# Patient Record
Sex: Female | Born: 1955 | Race: White | Hispanic: No | Smoking: Never smoker
Health system: Southern US, Community
[De-identification: ages and names within clinical notes are randomized; demographics above are authoritative.]

## PROBLEM LIST (undated history)

## (undated) DIAGNOSIS — M1990 Unspecified osteoarthritis, unspecified site: Secondary | ICD-10-CM

## (undated) HISTORY — PX: OOPHORECTOMY: SHX86

## (undated) HISTORY — PX: ABDOMINAL HYSTERECTOMY: SHX81

## (undated) HISTORY — PX: BACK SURGERY: SHX140

---

## 1998-04-14 ENCOUNTER — Encounter: Payer: Self-pay | Admitting: Neurosurgery

## 1998-04-14 ENCOUNTER — Ambulatory Visit (HOSPITAL_COMMUNITY): Admission: RE | Admit: 1998-04-14 | Discharge: 1998-04-14 | Payer: Self-pay | Admitting: Neurosurgery

## 1998-04-29 ENCOUNTER — Encounter: Payer: Self-pay | Admitting: Neurosurgery

## 1998-04-29 ENCOUNTER — Ambulatory Visit (HOSPITAL_COMMUNITY): Admission: RE | Admit: 1998-04-29 | Discharge: 1998-04-29 | Payer: Self-pay | Admitting: Neurosurgery

## 1998-04-30 ENCOUNTER — Encounter: Payer: Self-pay | Admitting: Neurosurgery

## 1998-04-30 ENCOUNTER — Ambulatory Visit (HOSPITAL_COMMUNITY): Admission: RE | Admit: 1998-04-30 | Discharge: 1998-04-30 | Payer: Self-pay | Admitting: Neurosurgery

## 1998-05-04 ENCOUNTER — Encounter: Payer: Self-pay | Admitting: Neurosurgery

## 1998-05-05 ENCOUNTER — Inpatient Hospital Stay (HOSPITAL_COMMUNITY): Admission: RE | Admit: 1998-05-05 | Discharge: 1998-05-07 | Payer: Self-pay | Admitting: Neurosurgery

## 1998-05-05 ENCOUNTER — Encounter: Payer: Self-pay | Admitting: Neurosurgery

## 1998-09-07 DIAGNOSIS — C4491 Basal cell carcinoma of skin, unspecified: Secondary | ICD-10-CM

## 1998-09-07 HISTORY — DX: Basal cell carcinoma of skin, unspecified: C44.91

## 1998-12-10 DIAGNOSIS — D229 Melanocytic nevi, unspecified: Secondary | ICD-10-CM

## 1998-12-10 HISTORY — DX: Melanocytic nevi, unspecified: D22.9

## 1999-01-10 ENCOUNTER — Ambulatory Visit (HOSPITAL_COMMUNITY): Admission: RE | Admit: 1999-01-10 | Discharge: 1999-01-10 | Payer: Self-pay | Admitting: Gastroenterology

## 2000-03-28 ENCOUNTER — Other Ambulatory Visit: Admission: RE | Admit: 2000-03-28 | Discharge: 2000-03-28 | Payer: Self-pay | Admitting: Obstetrics and Gynecology

## 2001-10-21 ENCOUNTER — Ambulatory Visit (HOSPITAL_COMMUNITY): Admission: RE | Admit: 2001-10-21 | Discharge: 2001-10-21 | Payer: Self-pay | Admitting: Family Medicine

## 2001-10-21 ENCOUNTER — Encounter: Payer: Self-pay | Admitting: Family Medicine

## 2002-02-07 ENCOUNTER — Encounter: Payer: Self-pay | Admitting: Family Medicine

## 2002-02-07 ENCOUNTER — Ambulatory Visit (HOSPITAL_COMMUNITY): Admission: RE | Admit: 2002-02-07 | Discharge: 2002-02-07 | Payer: Self-pay | Admitting: Family Medicine

## 2002-08-21 ENCOUNTER — Other Ambulatory Visit: Admission: RE | Admit: 2002-08-21 | Discharge: 2002-08-21 | Payer: Self-pay | Admitting: Obstetrics and Gynecology

## 2003-03-16 ENCOUNTER — Ambulatory Visit (HOSPITAL_COMMUNITY): Admission: RE | Admit: 2003-03-16 | Discharge: 2003-03-16 | Payer: Self-pay | Admitting: Family Medicine

## 2003-11-24 ENCOUNTER — Ambulatory Visit (HOSPITAL_COMMUNITY): Admission: RE | Admit: 2003-11-24 | Discharge: 2003-11-24 | Payer: Self-pay | Admitting: Family Medicine

## 2004-02-20 ENCOUNTER — Emergency Department (HOSPITAL_COMMUNITY): Admission: EM | Admit: 2004-02-20 | Discharge: 2004-02-20 | Payer: Self-pay | Admitting: Emergency Medicine

## 2004-05-11 ENCOUNTER — Ambulatory Visit (HOSPITAL_COMMUNITY): Admission: RE | Admit: 2004-05-11 | Discharge: 2004-05-11 | Payer: Self-pay | Admitting: Family Medicine

## 2004-06-06 ENCOUNTER — Ambulatory Visit (HOSPITAL_COMMUNITY): Admission: RE | Admit: 2004-06-06 | Discharge: 2004-06-06 | Payer: Self-pay | Admitting: Gastroenterology

## 2004-12-20 ENCOUNTER — Ambulatory Visit (HOSPITAL_COMMUNITY): Admission: RE | Admit: 2004-12-20 | Discharge: 2004-12-20 | Payer: Self-pay | Admitting: Family Medicine

## 2005-06-05 ENCOUNTER — Ambulatory Visit (HOSPITAL_COMMUNITY): Admission: RE | Admit: 2005-06-05 | Discharge: 2005-06-05 | Payer: Self-pay | Admitting: Obstetrics and Gynecology

## 2006-10-05 ENCOUNTER — Ambulatory Visit (HOSPITAL_COMMUNITY): Admission: RE | Admit: 2006-10-05 | Discharge: 2006-10-05 | Payer: Self-pay | Admitting: Family Medicine

## 2006-11-21 ENCOUNTER — Ambulatory Visit (HOSPITAL_COMMUNITY): Admission: RE | Admit: 2006-11-21 | Discharge: 2006-11-21 | Payer: Self-pay | Admitting: Family Medicine

## 2006-11-22 ENCOUNTER — Encounter (HOSPITAL_COMMUNITY): Admission: RE | Admit: 2006-11-22 | Discharge: 2006-12-11 | Payer: Self-pay | Admitting: Family Medicine

## 2007-06-04 ENCOUNTER — Emergency Department (HOSPITAL_COMMUNITY): Admission: EM | Admit: 2007-06-04 | Discharge: 2007-06-04 | Payer: Self-pay | Admitting: Emergency Medicine

## 2007-10-22 ENCOUNTER — Ambulatory Visit (HOSPITAL_COMMUNITY): Admission: RE | Admit: 2007-10-22 | Discharge: 2007-10-22 | Payer: Self-pay | Admitting: Family Medicine

## 2008-07-01 ENCOUNTER — Encounter (INDEPENDENT_AMBULATORY_CARE_PROVIDER_SITE_OTHER): Payer: Self-pay | Admitting: Obstetrics and Gynecology

## 2008-07-01 ENCOUNTER — Ambulatory Visit (HOSPITAL_COMMUNITY): Admission: RE | Admit: 2008-07-01 | Discharge: 2008-07-02 | Payer: Self-pay | Admitting: Obstetrics and Gynecology

## 2008-11-23 ENCOUNTER — Ambulatory Visit (HOSPITAL_COMMUNITY): Admission: RE | Admit: 2008-11-23 | Discharge: 2008-11-23 | Payer: Self-pay | Admitting: Family Medicine

## 2009-11-25 ENCOUNTER — Ambulatory Visit (HOSPITAL_COMMUNITY): Admission: RE | Admit: 2009-11-25 | Discharge: 2009-11-25 | Payer: Self-pay | Admitting: Obstetrics and Gynecology

## 2010-05-16 ENCOUNTER — Other Ambulatory Visit: Payer: Self-pay | Admitting: Dermatology

## 2010-06-22 LAB — CBC
HCT: 34.2 % — ABNORMAL LOW (ref 36.0–46.0)
HCT: 42.9 % (ref 36.0–46.0)
Hemoglobin: 11.6 g/dL — ABNORMAL LOW (ref 12.0–15.0)
Hemoglobin: 14.6 g/dL (ref 12.0–15.0)
MCHC: 33.9 g/dL (ref 30.0–36.0)
MCHC: 34.1 g/dL (ref 30.0–36.0)
MCV: 89.9 fL (ref 78.0–100.0)
MCV: 90.2 fL (ref 78.0–100.0)
Platelets: 216 10*3/uL (ref 150–400)
Platelets: 255 10*3/uL (ref 150–400)
RBC: 3.79 MIL/uL — ABNORMAL LOW (ref 3.87–5.11)
RBC: 4.77 MIL/uL (ref 3.87–5.11)
RDW: 13 % (ref 11.5–15.5)
RDW: 13.2 % (ref 11.5–15.5)
WBC: 13.6 10*3/uL — ABNORMAL HIGH (ref 4.0–10.5)
WBC: 8.8 10*3/uL (ref 4.0–10.5)

## 2010-06-22 LAB — URINALYSIS, ROUTINE W REFLEX MICROSCOPIC
Bilirubin Urine: NEGATIVE
Glucose, UA: NEGATIVE mg/dL
Hgb urine dipstick: NEGATIVE
Ketones, ur: NEGATIVE mg/dL
Nitrite: NEGATIVE
Protein, ur: NEGATIVE mg/dL
Specific Gravity, Urine: <1.005 — ABNORMAL LOW (ref 1.005–1.030)
Urobilinogen, UA: 0.2 mg/dL (ref 0.0–1.0)
pH: 6 (ref 5.0–8.0)

## 2010-06-22 LAB — COMPREHENSIVE METABOLIC PANEL
ALT: 25 U/L (ref 0–35)
AST: 23 U/L (ref 0–37)
Albumin: 4.1 g/dL (ref 3.5–5.2)
Alkaline Phosphatase: 108 U/L (ref 39–117)
BUN: 8 mg/dL (ref 6–23)
CO2: 28 mEq/L (ref 19–32)
Calcium: 9.6 mg/dL (ref 8.4–10.5)
Chloride: 101 mEq/L (ref 96–112)
Creatinine, Ser: 0.76 mg/dL (ref 0.4–1.2)
GFR calc Af Amer: >60 mL/min (ref >60)
GFR calc non Af Amer: >60 mL/min (ref >60)
Glucose, Bld: 91 mg/dL (ref 70–99)
Potassium: 3.8 mEq/L (ref 3.5–5.1)
Sodium: 136 mEq/L (ref 135–145)
Total Bilirubin: 0.5 mg/dL (ref 0.3–1.2)
Total Protein: 7.7 g/dL (ref 6.0–8.3)

## 2010-06-22 LAB — PROTIME-INR
INR: 0.9 (ref 0.00–1.49)
Prothrombin Time: 12.7 seconds (ref 11.6–15.2)

## 2010-06-22 LAB — APTT: aPTT: 27 seconds (ref 24–37)

## 2010-06-22 LAB — HEMOGLOBIN AND HEMATOCRIT, BLOOD
HCT: 38.1 % (ref 36.0–46.0)
Hemoglobin: 12.9 g/dL (ref 12.0–15.0)

## 2010-06-24 ENCOUNTER — Other Ambulatory Visit: Payer: Self-pay | Admitting: Obstetrics and Gynecology

## 2010-07-26 NOTE — Op Note (Signed)
NAMECAITLAIN, Ana Williams                ACCOUNT NO.:  [id number]   MEDICAL RECORD NO.:  [id number]          PATIENT TYPE:  AMB   LOCATION:  SDC                           FACILITY:  [facility]   PHYSICIAN:  Miguel Aschoff, M.D.       DATE OF BIRTH:  [birth year]   DATE OF PROCEDURE:  07/01/2008  DATE OF DISCHARGE:                               OPERATIVE REPORT   PREOPERATIVE DIAGNOSES:  1. Pelvic pain.  2. Dyspareunia.   POSTOPERATIVE DIAGNOSIS:  Pelvic adhesions.   PROCEDURES:  Laparoscopy with lysis of adhesions, bilateral laparoscopic  salpingo-oophorectomy.   SURGEON:  Miguel Aschoff, MD   ASSISTANT:  Luvenia Redden, MD   ANESTHESIA:  General.   COMPLICATIONS:  None.   JUSTIFICATION:  The patient is a 55 year old white female status post  prior hysterectomy with a long history of pelvic pain and dyspareunia.  Outpatient evaluation has not revealed the source of the pain and prior  therapeutic measures have not improved and she presents now to undergo  laparoscopy and laparotomy if indicated to try to resolve the pelvic  pain which was thought to be secondary to pelvic adhesions.  Risks and  benefits of the procedure were discussed with the patient.  Informed  consent has been obtained.   PROCEDURE:  The patient was taken to the operating room, placed in the  supine position, and general anesthesia was administered without  difficulty.  She was then placed in the dorsal lithotomy position,  prepped and draped in usual sterile fashion.  After this was done,  bladder was catheterized.  A sponge stick was placed in vagina with  sponges to allow manipulation of the cuff if needed.  Attention was then  directed to the umbilicus where a small infraumbilical incision was  made.  A Veress needle was inserted and the abdomen was insufflated with  3 liters of CO2.  Following insufflation, the trocar through the  laparoscope was placed followed by laparoscope itself.  Then under  direct visualization,  a 5-mm port was established in the right lower  quadrant and a 11-mm port established in the left lower quadrant without  difficulty.  Inspection revealed some adhesions of the omentum to the  left lateral pelvic side wall with filmy adhesions involving the  appendices epiploica and omentum to the left ovary.  On the right side,  the right ovary was noted to be adhesed to the apex of the vaginal cuff  and this was felt to be the source of the patient's dyspareunia.  No  other abnormalities were noted in the abdomen.  The gallbladder was  inspected and was noted to be within normal limits.  Grossly, the liver  was unremarkable.  At this point, the gyrus unit was introduced.  The  infundibulopelvic ligament on the right side was identified as was the  ureter, and at this point, the infundibulopelvic ligament was cauterized  and cut and the dissection continued medially towards the apex of vagina  using serial cauterizations and cuts to free the right ovary off of the  vaginal cuff and other  pelvic structures.  Care was done to avoid any  injury to adjacent vascular structures of the ureter or the bladder.  This was done successfully.  On the left side, the filmy adhesions  involving the omentum appendices epiploica were grasped with a gyrus  forceps, cauterized and cut, taken down without difficulty.  It was then  possible to elevate the left ovary to find the infundibulopelvic  ligament, cauterize this ligament and cut it, and then dissection  continued using serial cauterizations and cuts to free the left ovary  without difficulty.  At this point, inspection was made for hemostasis.  Hemostasis was excellent.  An EndoCatch unit was then placed through the  11-mm port.  The tubes and ovaries were placed into the EndoCatch bag  and brought out through the left lower quadrant incision.  It was then  possible to remove the specimen without difficulty.  Final inspection  was made for  hemostasis.  Hemostasis appeared to be excellent, and at  this point, the procedure was completed.  CO2 was allowed to escape.  The small incisions were then closed using subcuticular 4-0 Vicryl.  The  estimated blood loss of the procedure was minimal.  The patient  tolerated the procedure well.  The port sites prior to bringing the  patient to recovery room were injected with 1% Marcaine.  A total of 10  mL were used.   Plan is the patient to be observed overnight to be discharged home on  July 02, 2008.      Miguel Aschoff, M.D.  Electronically Signed     AR/MEDQ  D:  07/01/2008  T:  07/01/2008  Job:  [id number]

## 2010-07-29 NOTE — Op Note (Signed)
Ana Williams, Ana Williams                ACCOUNT NO.:  [id number]   MEDICAL RECORD NO.:  [id number]          PATIENT TYPE:  AMB   LOCATION:  ENDO                         FACILITY:  [facility]   PHYSICIAN:  John C. Madilyn Fireman, M.D.    DATE OF BIRTH:  29-Aug-1955   DATE OF PROCEDURE:  06/06/2004  DATE OF DISCHARGE:                                 OPERATIVE REPORT   PROCEDURE:  Colonoscopy.   INDICATIONS FOR PROCEDURE:  Family history of colon cancer in a first-degree  relative.   DESCRIPTION OF PROCEDURE:  The patient was placed in the left lateral  decubitus position and placed on the pulse monitor with continuous low-flow  oxygen delivered by nasal cannula.  She was sedated with 100 mcg IV Fentanyl  and 10 mg IV Versed.  The Olympus videocolonoscope was inserted into the  rectum and advanced to the cecum, confirmed by transillumination of  McBurney's point and visualization of the ileocecal valve and appendiceal  orifice.  The prep was good.  The cecum, ascending, transverse, descending,  and sigmoid colon all appeared normal, with no masses, polyps, diverticula,  or other mucosal abnormalities.  The rectum likewise appeared normal.  Retroflex view of the anus revealed no obvious internal hemorrhoids.  The  scope was then withdrawn.  The patient returned to the recovery room in  stable condition.  She tolerated the procedure well, and there were no  immediate complications.   IMPRESSION:  Normal colonoscopy.   PLAN:  Repeat study in five years.      JCH/MEDQ  D:  06/06/2004  T:  06/06/2004  Job:  [id number]   cc:   Brett Canales A. Cleta Alberts, M.D.  [address]  [place]  [place] [address]  Fax: [phone]

## 2010-10-17 ENCOUNTER — Other Ambulatory Visit: Payer: Self-pay | Admitting: Dermatology

## 2011-06-12 ENCOUNTER — Other Ambulatory Visit (HOSPITAL_COMMUNITY): Payer: Self-pay | Admitting: Obstetrics and Gynecology

## 2011-06-12 DIAGNOSIS — Z139 Encounter for screening, unspecified: Secondary | ICD-10-CM

## 2011-06-16 ENCOUNTER — Ambulatory Visit (HOSPITAL_COMMUNITY): Payer: Self-pay

## 2011-06-20 ENCOUNTER — Ambulatory Visit (HOSPITAL_COMMUNITY)
Admission: RE | Admit: 2011-06-20 | Discharge: 2011-06-20 | Disposition: A | Discharge status: Home / Self Care | Payer: Managed Care, Other (non HMO) | Source: Ambulatory Visit | Attending: Obstetrics and Gynecology | Admitting: Obstetrics and Gynecology

## 2011-06-20 DIAGNOSIS — Z1231 Encounter for screening mammogram for malignant neoplasm of breast: Secondary | ICD-10-CM | POA: Insufficient documentation

## 2011-06-20 DIAGNOSIS — Z139 Encounter for screening, unspecified: Secondary | ICD-10-CM

## 2011-08-21 ENCOUNTER — Other Ambulatory Visit (HOSPITAL_COMMUNITY): Payer: Self-pay | Admitting: Family Medicine

## 2011-08-21 DIAGNOSIS — M25469 Effusion, unspecified knee: Secondary | ICD-10-CM

## 2011-08-21 DIAGNOSIS — M25569 Pain in unspecified knee: Secondary | ICD-10-CM

## 2011-08-22 ENCOUNTER — Ambulatory Visit (HOSPITAL_COMMUNITY)
Admission: RE | Admit: 2011-08-22 | Discharge: 2011-08-22 | Disposition: A | Discharge status: Home / Self Care | Payer: Managed Care, Other (non HMO) | Source: Ambulatory Visit | Attending: Family Medicine | Admitting: Family Medicine

## 2011-08-22 DIAGNOSIS — M25469 Effusion, unspecified knee: Secondary | ICD-10-CM | POA: Insufficient documentation

## 2011-08-22 DIAGNOSIS — M25569 Pain in unspecified knee: Secondary | ICD-10-CM

## 2011-08-22 DIAGNOSIS — M899 Disorder of bone, unspecified: Secondary | ICD-10-CM | POA: Insufficient documentation

## 2011-08-22 DIAGNOSIS — M2240 Chondromalacia patellae, unspecified knee: Secondary | ICD-10-CM | POA: Insufficient documentation

## 2011-08-24 ENCOUNTER — Encounter: Payer: Self-pay | Admitting: Sports Medicine

## 2011-08-24 ENCOUNTER — Ambulatory Visit (INDEPENDENT_AMBULATORY_CARE_PROVIDER_SITE_OTHER): Payer: Managed Care, Other (non HMO) | Admitting: Sports Medicine

## 2011-08-24 ENCOUNTER — Other Ambulatory Visit (HOSPITAL_COMMUNITY): Payer: Managed Care, Other (non HMO)

## 2011-08-24 VITALS — BP 120/70 | Ht 69.0 in | Wt 168.0 lb

## 2011-08-24 DIAGNOSIS — M958 Other specified acquired deformities of musculoskeletal system: Secondary | ICD-10-CM | POA: Insufficient documentation

## 2011-08-24 DIAGNOSIS — M959 Acquired deformity of musculoskeletal system, unspecified: Secondary | ICD-10-CM

## 2011-08-24 DIAGNOSIS — S83209A Unspecified tear of unspecified meniscus, current injury, unspecified knee, initial encounter: Secondary | ICD-10-CM | POA: Insufficient documentation

## 2011-08-24 DIAGNOSIS — IMO0002 Reserved for concepts with insufficient information to code with codable children: Secondary | ICD-10-CM

## 2011-08-24 NOTE — Patient Instructions (Addendum)
Continue naproxen Quad exercises Glucosamine 1500mg  oral daily No running for 4 weeks, ok to bike and swim F/U in 4 weeks.

## 2011-08-24 NOTE — Progress Notes (Signed)
  Subjective:    Patient ID: Ana Williams, female    DOB: 12-16-1955, 56 y.o.   MRN: [id number]  HPI  Ana Williams is a pleasant 56 yo female patient, coming to our of his as a new patient complaining of left knee pain since last week, he denies any injury to her knee. She states that her left knee started hurting after she was running and swimming last week and subtly her knee locked in a flexed position, she developed severe pain and mild swelling, she was able to straighten her knee later. She was seen by her primary care physician would start her on naproxen as an anti-inflammatory and order a left knee MRI. She stated that she is 90% better compared with last week. She has full range of motion on her knee, mild pain in the anterior and medial aspect of the knee especially with walking up and down the steps, nor swelling, no instability, no numbness or tingling, no mechanical symptoms. The MRI showed a 7 mm OCD on the left femoral condyle, a degenerative tear tear of the anterior lateral menisci, degenerative tear of the posterior horn of the medial menisci and patellofemoral chondromalacia.  Patient Active Problem List  Diagnosis  . Osteochondral defect of femoral condyle  . Acute meniscal tear of knee   No current outpatient prescriptions on file prior to visit.   Allergies  Allergen Reactions  . Penicillins   . Sulfa Antibiotics      Review of Systems  Constitutional: Negative for fever, chills, diaphoresis and fatigue.  Musculoskeletal: Negative for back pain, joint swelling, arthralgias and gait problem.  Neurological: Negative for weakness and numbness.       Objective:   Physical Exam  Musculoskeletal:       Left knee with intact skin, FROM. Patellofemoral crepitus present with flexion and extension. Patellofemoral compression  test +. No tenderness on the quad neither or patellar tendon. Mild TTP in the medial joint line Ligaments intact. Lachman neg. Varus and  valgus test at 0 and 30 degres neg Poor quad muscle definition.   Feet with hallux valgus, wide forefoot, collapse mid arche Normal gait without a limp. Feet with mid arch.            Assessment & Plan:   1. Acute meniscal tear of knee   Left knee  2. Osteochondral defect of femoral condyle   lateral femoral condyle left knee   We have discussed with the patient today nature of her knee pain. We reviewed hair on extent the MRI findings. If she develop recurrent mechanical symptoms we can try a interarticular cortisone injection or proceed with a referral to an orthopedic for a knee scope. She has improved 90% with conservative treatment resumed on continue that option of treatment. The patient agrees .  Continue naproxen Quad exercises Glucosamine 1500mg  oral daily No running for 4 weeks, ok to bike and swim F/U in 4 weeks. Knee compression sleeve.

## 2011-09-21 ENCOUNTER — Encounter: Payer: Self-pay | Admitting: Sports Medicine

## 2011-09-21 ENCOUNTER — Ambulatory Visit (INDEPENDENT_AMBULATORY_CARE_PROVIDER_SITE_OTHER): Payer: Managed Care, Other (non HMO) | Admitting: Sports Medicine

## 2011-09-21 VITALS — BP 100/60

## 2011-09-21 DIAGNOSIS — IMO0002 Reserved for concepts with insufficient information to code with codable children: Secondary | ICD-10-CM

## 2011-09-21 DIAGNOSIS — M959 Acquired deformity of musculoskeletal system, unspecified: Secondary | ICD-10-CM

## 2011-09-21 DIAGNOSIS — S83209A Unspecified tear of unspecified meniscus, current injury, unspecified knee, initial encounter: Secondary | ICD-10-CM

## 2011-09-21 DIAGNOSIS — M958 Other specified acquired deformities of musculoskeletal system: Secondary | ICD-10-CM

## 2011-09-21 NOTE — Assessment & Plan Note (Signed)
Patient is improved with compression and relative rest.   Plan to continue the use of the knee sleeve. Additionally will initiate walking and elliptical training and exercise regime.   After one month if elliptical walking goes well we'll start light running. Will followup in 2 months.

## 2011-09-21 NOTE — Progress Notes (Signed)
Ana Williams is a 56 y.o. female who presents to [facility] today for followup left knee. Patient was evaluated in clinic approximately one month ago for a left meniscus injury as well as an OCD lesion to the femoral condyle.  She was treated with a knee sleeve as well as relative reduction of activity.   She feels much better. She loves her knee sleeve and is ready to restart her activity. Additionally she is taking Aleve one tablet twice a day and glucosamine and chondroitin as well as calcium.  She denies any locking catching or giving way or significant effusion.    PMH reviewed.  History  Substance Use Topics  . Smoking status: Not on file  . Smokeless tobacco: Not on file  . Alcohol Use: Not on file   ROS as above otherwise neg   Exam:  BP 100/60 Gen: Well NAD MSK: Left knee reveals no effusions or significant deformities. Range of motion is intact.  Negative Lachman's and negative McMurray's.  Patient does have some laxity of the LCL bilaterally however this is mild and without pain.  MCL is intact  Mr Knee Left  Wo Contrast  08/23/2011  *RADIOLOGY REPORT*  Clinical Data: Left knee pain. Joint effusion.  MRI OF THE LEFT KNEE WITHOUT CONTRAST  Technique:  Multiplanar, multisequence MR imaging of the left knee was performed.  No intravenous contrast was administered.  Comparison:   None  Findings:  There is a 7 mm focal full-thickness cartilage defect in the posterior central aspect of the lateral femoral condyle.  No definable loose body in the joint.  There is diffuse chondromalacia of the patella with marginal spur formation and small focal areas of grade 4 chondromalacia of the trochlear groove of the distal femur.  There is degeneration of the anterior horn of the lateral meniscus without a discrete tear.  There is slight focal fraying of the posterior horn of the medial meniscus without tear.  Slight marginal spur formation in the medial and lateral compartments.  Cruciate and  collateral ligaments are normal.  Distal quadriceps tendon and patellar tendon are normal.  No joint effusion.  Tiny Baker's cyst.  IMPRESSION: 1.  Moderate chondromalacia of the patellofemoral compartment. 2.  7 mm focal full-thickness cartilage defect in the lateral femoral condyle. 3.  Slight degeneration of the menisci without meniscal tears.  Original Report Authenticated By: Gwynn Burly, M.D.

## 2011-09-21 NOTE — Patient Instructions (Addendum)
Thank you for coming in today. You may swim with the knee sleeve if you want.  Gradually return to exercise.  Increase bike and swimming.  Start walking and elliptical.  If those go well please start light running about 1/2 of your normal level.  Continue quad exercises.  Come back in 2 months.  You can buy the Body Helix knee sleeves online. They have a nice website.  Your's is a Size Large Knee sleeve.  Go slow and stop with pain.

## 2011-09-21 NOTE — Assessment & Plan Note (Signed)
Not sure that this is symptomatic and certainly seems to have improved sxs today. We will follow to see if issues arise.

## 2011-11-22 ENCOUNTER — Ambulatory Visit (INDEPENDENT_AMBULATORY_CARE_PROVIDER_SITE_OTHER): Payer: Managed Care, Other (non HMO) | Admitting: Sports Medicine

## 2011-11-22 VITALS — BP 105/68 | Ht 68.0 in | Wt 169.0 lb

## 2011-11-22 DIAGNOSIS — M959 Acquired deformity of musculoskeletal system, unspecified: Secondary | ICD-10-CM

## 2011-11-22 DIAGNOSIS — IMO0002 Reserved for concepts with insufficient information to code with codable children: Secondary | ICD-10-CM

## 2011-11-22 DIAGNOSIS — M958 Other specified acquired deformities of musculoskeletal system: Secondary | ICD-10-CM

## 2011-11-22 DIAGNOSIS — S83209A Unspecified tear of unspecified meniscus, current injury, unspecified knee, initial encounter: Secondary | ICD-10-CM

## 2011-11-22 MED ORDER — NAPROXEN 500 MG PO TABS: 500.0000 mg | ORAL_TABLET | Freq: Two times a day (BID) | ORAL | Status: DC | PRN

## 2011-11-22 NOTE — Progress Notes (Signed)
  Subjective:    Patient ID: Ana Williams, female    DOB: May 29, 1955, 56 y.o.   MRN: [id number]  HPI  Pt presents to clinic for f/u of lt lateral knee pain which is improving. Has been using bodyhelix sleeve which is helpful. She has some clicking laterally but no pain with this MRI showed lat OCD lesion Since last visit has been walking and using elliptical for exercise. Did try easy running on treadmill last week, which she feels aggravated knee somewhat  No swelling as long as she uses copression No mechanical sxs   Review of Systems     Objective:   Physical Exam  knee exam: Flexion to 140 deg bilat Stable ligaments McMurray neg Thessaly neg No effusion   Transverse arch collapse on lt not rt Long arch loss bilat Early bunion change bilat Bunionette on lt           Assessment & Plan:

## 2011-11-22 NOTE — Assessment & Plan Note (Signed)
No meniscal tests postiive today  No mechanical sxs  Avoid deep knee bend activity  Start easy weight work for [employer]

## 2011-11-22 NOTE — Patient Instructions (Addendum)
Use compression sleeve for activity   Ok to start run/walking on treadmill - do easy running, concentrate form  Continue with elliptical, walking, and biking   Ok to do HALF squats, lunges, knee extensions, and step ups   Please schedule a f/u for custom orthotics- bring running shoes and shorts  Thank you for seeing [place] today!

## 2011-11-22 NOTE — Assessment & Plan Note (Signed)
This has steadily improved  She can gradually add more weight bearing activity  I think she needs good custom orthotics to get back into running as she has marked breakdown of feet  We will bring her back for this

## 2011-12-06 ENCOUNTER — Encounter: Payer: Self-pay | Admitting: Family Medicine

## 2011-12-06 ENCOUNTER — Ambulatory Visit (INDEPENDENT_AMBULATORY_CARE_PROVIDER_SITE_OTHER): Payer: Managed Care, Other (non HMO) | Admitting: Family Medicine

## 2011-12-06 VITALS — BP 132/74 | HR 70 | Ht 68.0 in | Wt 168.0 lb

## 2011-12-06 DIAGNOSIS — R269 Unspecified abnormalities of gait and mobility: Secondary | ICD-10-CM

## 2011-12-06 NOTE — Patient Instructions (Addendum)
Thank you for coming in today. Try those orthotics during the day and with exercise.  Come back if they are painful or uncomfortable.  Come back as needed for pain.  It may be a good idea to stop by in 2 months for a repeat evaluation.

## 2011-12-07 DIAGNOSIS — R269 Unspecified abnormalities of gait and mobility: Secondary | ICD-10-CM | POA: Insufficient documentation

## 2011-12-07 NOTE — Progress Notes (Signed)
Ana Williams is a 56 y.o. female who presents to [facility] today for Patient is here today for  orthotics.  She was seen by Dr. Darrick Penna on 11/29/11 for knee pain.  At that visit she was found to have significant foot breakdown and an abnormal gait.  She was asked to schedule today for custom orthotics.  She feels well and notes that her knee pain is improving with a compressive knee sleeve.  Her goal is to resume running if possible.   PMH reviewed. OCD lesion of the right knee History  Substance Use Topics  . Smoking status: Never Smoker   . Smokeless tobacco: Never Used  . Alcohol Use: Not on file   ROS as above otherwise neg   Exam:  BP 132/74  Pulse 70  Ht 5\' 8"  (1.727 m)  Wt 168 lb (76.204 kg)  BMI 25.54 kg/m2 Gen: Well NAD MSK: Marked foot breakdown. Significant bunions bilaterally. Collapse of the transverse arch bilaterally  Gait:  Slight antalgic gait secondary to right knee pain  Patient was fitted for a : standard, orthotic. The orthotic was heated and afterward the patient stood on the orthotic blank positioned on the orthotic stand. The patient was positioned in subtalar neutral position and 10 degrees of ankle dorsiflexion in a weight bearing stance. After completion of molding, a stable base was applied to the orthotic blank. The blank was ground to a stable position for weight bearing. Size 9: Base: Blue EVA Posting and Padding: First ray post BL  Patient was comfortable in the orthotics with gait trial.

## 2011-12-07 NOTE — Assessment & Plan Note (Signed)
Custom orthotics made today. Patient is comfortable in the orthotics. Plan to followup with Dr. Darrick Penna in one or 2 months for recheck of knee

## 2012-02-13 ENCOUNTER — Encounter: Payer: Managed Care, Other (non HMO) | Admitting: Sports Medicine

## 2012-03-14 ENCOUNTER — Encounter: Payer: Managed Care, Other (non HMO) | Admitting: Sports Medicine

## 2012-07-27 ENCOUNTER — Emergency Department (HOSPITAL_COMMUNITY): Payer: Managed Care, Other (non HMO)

## 2012-07-27 ENCOUNTER — Encounter (HOSPITAL_COMMUNITY): Payer: Self-pay

## 2012-07-27 ENCOUNTER — Emergency Department (HOSPITAL_COMMUNITY)
Admission: EM | Admit: 2012-07-27 | Discharge: 2012-07-27 | Disposition: A | Discharge status: Home / Self Care | Payer: Managed Care, Other (non HMO) | Attending: Emergency Medicine | Admitting: Emergency Medicine

## 2012-07-27 DIAGNOSIS — Z79899 Other long term (current) drug therapy: Secondary | ICD-10-CM | POA: Insufficient documentation

## 2012-07-27 DIAGNOSIS — R079 Chest pain, unspecified: Secondary | ICD-10-CM | POA: Insufficient documentation

## 2012-07-27 DIAGNOSIS — Z880 Allergy status to penicillin: Secondary | ICD-10-CM | POA: Insufficient documentation

## 2012-07-27 DIAGNOSIS — M549 Dorsalgia, unspecified: Secondary | ICD-10-CM | POA: Insufficient documentation

## 2012-07-27 LAB — BASIC METABOLIC PANEL
BUN: 9 mg/dL (ref 6–23)
Calcium: 9.5 mg/dL (ref 8.4–10.5)
Creatinine, Ser: 0.7 mg/dL (ref 0.50–1.10)
GFR calc Af Amer: >90 mL/min (ref >90)
GFR calc non Af Amer: >90 mL/min (ref >90)
Glucose, Bld: 98 mg/dL (ref 70–99)

## 2012-07-27 LAB — CBC WITH DIFFERENTIAL/PLATELET
Basophils Relative: 0 % (ref 0–1)
Eosinophils Absolute: 0.1 10*3/uL (ref 0.0–0.7)
Eosinophils Relative: 2 % (ref 0–5)
Hemoglobin: 13.1 g/dL (ref 12.0–15.0)
Lymphs Abs: 1.3 10*3/uL (ref 0.7–4.0)
MCH: 29.3 pg (ref 26.0–34.0)
MCHC: 33 g/dL (ref 30.0–36.0)
MCV: 88.8 fL (ref 78.0–100.0)
Monocytes Relative: 10 % (ref 3–12)
RBC: 4.47 MIL/uL (ref 3.87–5.11)

## 2012-07-27 MED ORDER — ASPIRIN 325 MG PO TABS
325.0000 mg | ORAL_TABLET | Freq: Once | ORAL | Status: AC
  Administered 2012-07-27: 325 mg via ORAL
  Filled 2012-07-27: qty 1

## 2012-07-27 NOTE — ED Notes (Signed)
Patient with no complaints at this time. Respirations even and unlabored. Skin warm/dry. Discharge instructions reviewed with patient at this time. Patient given opportunity to voice concerns/ask questions. Patient discharged at this time and left Emergency Department with steady gait.   

## 2012-07-27 NOTE — ED Provider Notes (Signed)
History  This chart was scribed for Joya Gaskins, MD by Shari Heritage, ED Scribe. The patient was seen in room APA09/APA09. Patient's care was started at 1027.   CSN: [id number]  Arrival date & time 07/27/12  1017   First MD Initiated Contact with Patient 07/27/12 1027      Chief Complaint  Patient presents with  . Chest Pain     The history is provided by the patient. No language interpreter was used.    HPI Comments: Ana Williams is a 57 y.o. female who presents to the Emergency Department complaining of intermittent, burning, left chest pain onset 1 week ago. Patient states that pain is usually located underneath the left breast. Chest pain is sometimes worse with arm movement. Her last occurrence of chest pain was this morning which was accompanied by mid back pain. She says that she is still having discomfort in her chest and back at this time. Patient took Mucinex earlier when she thought discomfort might be due to congestion, but she has not taken any aspirin. She denies fever, cough, shortness of breath, nausea, vomiting, lightheadedness or diaphoresis. There is no dyspnea on exertion, pain with inspiration, abdominal pain or leg swelling. Patient denies history of MI, DVT or PE. Patient has a family history of CHF (father) and pancreatic cancer (mother). Patient was advised to come to the ED by Dr. Sherwood Gambler.    PMH - none Family history - CHF    History  Substance Use Topics  . Smoking status: Never Smoker   . Smokeless tobacco: Never Used  . Alcohol Use: Not on file    OB History   Grav Para Term Preterm Abortions TAB SAB Ect Mult Living                  Review of Systems A complete 10 system review of systems was obtained and all systems are negative except as noted in the HPI and PMH.   Allergies  Penicillins and Sulfa antibiotics  Home Medications   Current Outpatient Rx  Name  Route  Sig  Dispense  Refill  . naproxen (NAPROSYN) 500 MG tablet   Oral   Take 1 tablet (500 mg total) by mouth 2 (two) times daily as needed.   60 tablet   2   BP 136/83  Pulse 78  Temp(Src) 98.3 F (36.8 C) (Oral)  Resp 18  SpO2 95%  Physical Exam CONSTITUTIONAL: Well developed/well nourished HEAD: Normocephalic/atraumatic EYES: EOMI/PERRL ENMT: Mucous membranes moist NECK: supple no meningeal signs SPINE:entire spine nontender CV: S1/S2 noted, no murmurs/rubs/gallops noted Chest- mild tenderness to left chest, no crepitance noted LUNGS: Lungs are clear to auscultation bilaterally, no apparent distress ABDOMEN: soft, nontender, no rebound or guarding GU:no cva tenderness NEURO: Pt is awake/alert, moves all extremitiesx4 EXTREMITIES: pulses normal, full ROM, no tenderness, no edema, calves nontender SKIN: warm, color normal PSYCH: no abnormalities of mood noted  ED Course  Procedures  COORDINATION OF CARE: 10:43 AM- Patient informed of current plan for treatment and evaluation and agrees with plan at this time.   11:27 AM  I spoke to her PCP about this case who sent patient.  He felt (dr fusco) if pain improved and negative markers he could arrange outpatient stress Pt appears low risk, story somewhat atypical Now feeling well Will initiate workup   Pt improved in the ED HEART score less than 3 Repeat ekg/troponin negative I doubt ACS at this time.  PE unlikely give history/exam  Stable for d/c.  Do not feel she needs inpatient stay for further workup Dr Sherwood Gambler will see as outpatient   MDM  Nursing notes including past medical history and social history reviewed and considered in documentation Labs/vital reviewed and considered xrays reviewed and considered      Date: 07/27/2012 1028am  Rate: 75  Rhythm: normal sinus rhythm  QRS Axis: rightward  Intervals: normal  ST/T Wave abnormalities: normal  Conduction Disutrbances:none  Narrative Interpretation:   Old EKG Reviewed: unchanged    Date: 07/27/2012 1330  Rate: 82   Rhythm: normal sinus rhythm  QRS Axis: right  Intervals: normal  ST/T Wave abnormalities: normal  Conduction Disutrbances:none  Narrative Interpretation:   Old EKG Reviewed: unchanged     I personally performed the services described in this documentation, which was scribed in my presence. The recorded information has been reviewed and is accurate.      Joya Gaskins, MD 07/27/12 1515

## 2012-07-27 NOTE — ED Notes (Signed)
Pt reports mid-sternal cp for 1 week, pain is off/on, denies any sob, no cough or fever. pai is now in the mid back area. Describes as burning as sensation.

## 2012-09-11 ENCOUNTER — Ambulatory Visit: Payer: Managed Care, Other (non HMO) | Admitting: Cardiovascular Disease

## 2012-10-14 ENCOUNTER — Encounter: Payer: Self-pay | Admitting: *Deleted

## 2012-10-14 DIAGNOSIS — K5790 Diverticulosis of intestine, part unspecified, without perforation or abscess without bleeding: Secondary | ICD-10-CM

## 2012-10-14 DIAGNOSIS — C4491 Basal cell carcinoma of skin, unspecified: Secondary | ICD-10-CM | POA: Insufficient documentation

## 2012-10-14 DIAGNOSIS — M940 Chondrocostal junction syndrome [Tietze]: Secondary | ICD-10-CM | POA: Insufficient documentation

## 2012-10-14 DIAGNOSIS — K219 Gastro-esophageal reflux disease without esophagitis: Secondary | ICD-10-CM

## 2012-10-15 ENCOUNTER — Encounter: Payer: Self-pay | Admitting: Cardiovascular Disease

## 2012-10-15 ENCOUNTER — Encounter: Payer: Self-pay | Admitting: *Deleted

## 2012-10-15 ENCOUNTER — Ambulatory Visit (INDEPENDENT_AMBULATORY_CARE_PROVIDER_SITE_OTHER): Payer: 59 | Admitting: Cardiovascular Disease

## 2012-10-15 VITALS — BP 121/85 | HR 96 | Ht 68.0 in | Wt 194.0 lb

## 2012-10-15 DIAGNOSIS — R079 Chest pain, unspecified: Secondary | ICD-10-CM

## 2012-10-15 DIAGNOSIS — R635 Abnormal weight gain: Secondary | ICD-10-CM | POA: Insufficient documentation

## 2012-10-15 NOTE — Patient Instructions (Addendum)
Your physician recommends that you schedule a follow-up appointment in: AS NEEDED  Your physician has requested that you have an exercise tolerance test. For further information please visit [email or web address]. Please also follow instruction sheet, as given.    

## 2012-10-15 NOTE — Assessment & Plan Note (Signed)
Discussed low carb diet and exercise program She has a meniscal tear in left knee and has seen [employer].  F/U with him to assess activity level

## 2012-10-15 NOTE — Assessment & Plan Note (Signed)
Atypical likely GERD and or muscular  Normal ECG  F/U ETT

## 2012-10-15 NOTE — Progress Notes (Signed)
Patient ID: MAURICA OMURA, female   DOB: 1956-03-04, 57 y.o.   MRN: [id number]   KIP KAUTZMAN is a 57 y.o. female who presents to the Emergency Department 07/27/12  complaining of intermittent, burning, left chest pain onset 1 week ago. Patient states that pain is usually located underneath the left breast. Chest pain is sometimes worse with arm movement. Her last occurrence of chest pain was this morning which was accompanied by mid back pain. She says that she is still having discomfort in her chest and back at this time. Patient took Mucinex earlier when she thought discomfort might be due to congestion, but she has not taken any aspirin. She denies fever, cough, shortness of breath, nausea, vomiting, lightheadedness or diaphoresis. There is no dyspnea on exertion, pain with inspiration, abdominal pain or leg swelling. Patient denies history of MI, DVT or PE. Patient has a family history of CHF (father) and pancreatic cancer (mother). R/O and negative ER evaluaiton  Since then ok  A lot of her symptoms seem to be related to stress at work.  Some relief with NSAI's and some with TUMS  Has gained 30 lbs the last year and is more sedentary  ROS: Denies fever, malais, weight loss, blurry vision, decreased visual acuity, cough, sputum, SOB, hemoptysis, pleuritic pain, palpitaitons, heartburn, abdominal pain, melena, lower extremity edema, claudication, or rash.  All other systems reviewed and negative   General: Affect appropriate Healthy:  appears stated age HEENT: normal Neck supple with no adenopathy JVP normal no bruits no thyromegaly Lungs clear with no wheezing and good diaphragmatic motion Heart:  S1/S2 no murmur,rub, gallop or click PMI normal Abdomen: benighn, BS positve, no tenderness, no AAA no bruit.  No HSM or HJR Distal pulses intact with no bruits No edema Neuro non-focal Skin warm and dry No muscular weakness  Medications Current Outpatient Prescriptions  Medication Sig  Dispense Refill  . acetaminophen (TYLENOL) 500 MG tablet Take 1,000 mg by mouth every 6 (six) hours as needed for pain.      . naproxen sodium (ALEVE) 220 MG tablet Take 220 mg by mouth 3 (three) times daily as needed (pain).       No current facility-administered medications for this visit.    Allergies Penicillins and Sulfa antibiotics  Family History: No family history on file.  Social History: History   Social History  . Marital Status: Divorced    Spouse Name: N/A    Number of Children: N/A  . Years of Education: N/A   Occupational History  . Not on file.   Social History Main Topics  . Smoking status: Never Smoker   . Smokeless tobacco: Never Used  . Alcohol Use: No  . Drug Use: No  . Sexually Active: Yes    Birth Control/ Protection: Surgical   Other Topics Concern  . Not on file   Social History Narrative  . No narrative on file    Electrocardiogram:  NSR rate 90  Normal 07/27/12  NSR rate 76 normal and no different than today   Assessment and Plan

## 2012-10-23 ENCOUNTER — Ambulatory Visit (HOSPITAL_COMMUNITY)
Admission: RE | Admit: 2012-10-23 | Discharge: 2012-10-23 | Disposition: A | Discharge status: Home / Self Care | Payer: 59 | Source: Ambulatory Visit | Attending: Cardiovascular Disease | Admitting: Cardiovascular Disease

## 2012-10-23 ENCOUNTER — Encounter (HOSPITAL_COMMUNITY): Payer: Self-pay

## 2012-10-23 ENCOUNTER — Encounter: Payer: Self-pay | Admitting: Cardiology

## 2012-10-23 DIAGNOSIS — R079 Chest pain, unspecified: Secondary | ICD-10-CM

## 2012-10-23 NOTE — Progress Notes (Signed)
Stress Lab Nurses Notes - Ana Williams  Ana Williams 10/23/2012 Reason for doing test: Chest Pain Type of test: Regular GTX Nurse performing test: Parke Poisson, RN Nuclear Medicine Tech: Not Applicable Echo Tech: Not Applicable MD performing test: Ival Bible & Jacolyn Reedy PA Family MD: Fusco Test explained and consent signed: yes IV started: No IV started Symptoms: None Treatment/Intervention: None Reason test stopped: reached target HR After recovery IV was: NA Patient to return to Nuc. Med at :NA Patient discharged: Home Patient's Condition upon discharge was: stable Comments: During test peak BP 193/62 & HR 187.  Recovery BP 148/65 & HR 112.  Symptoms resolved in recovery. [name] T

## 2012-10-23 NOTE — Progress Notes (Signed)
Stress Lab Nurses Notes - Ana Williams   Ana Williams  10/23/2012  Reason for doing test: Chest Pain  Type of test: Regular GTX  Nurse performing test: Parke Poisson, RN  Nuclear Medicine Tech: Not Applicable  Echo Tech: Not Applicable  MD performing test: Ival Bible & Jacolyn Reedy PA  Family MD: Fusco  Test explained and consent signed: yes  IV started: No IV started  Symptoms: None  Treatment/Intervention: None  Reason test stopped: reached target HR  After recovery IV was: NA  Patient to return to Nuc. Med at :NA  Patient discharged: Home  Patient's Condition upon discharge was: stable  Comments: During test peak BP 193/62 & HR 187. Recovery BP 148/65 & HR 112. Symptoms resolved in recovery.  Ana Williams  Attending note:  Ana Williams, achieved 13 METS, adequate heart rate response, rapid increase in early exercise. No chest pain. Hypertensive response noted. Upsloping, nondiagnostic ST changes, no arrhythmias. Overall normal study for assessment of ischemia.  Ana Williams, M.D., F.A.C.C.

## 2012-10-24 ENCOUNTER — Telehealth: Payer: Self-pay | Admitting: *Deleted

## 2012-10-24 NOTE — Patient Instructions (Signed)
Spoke to pt to advise results/instructions. Pt understood.  

## 2012-10-24 NOTE — Telephone Encounter (Signed)
Spoke to pt to advise results/instructions. Pt understood.  

## 2012-10-24 NOTE — Telephone Encounter (Signed)
Message copied by Ovidio Kin on Thu Oct 24, 2012  3:57 PM ------      Message from: Wendall Stade      Created: Wed Oct 23, 2012  4:56 PM       Normal nuclear study            ----- Message -----         From: Jonelle Sidle, MD         Sent: 10/23/2012   4:08 PM           To: Wendall Stade, MD                   ------

## 2013-06-16 ENCOUNTER — Other Ambulatory Visit (HOSPITAL_COMMUNITY): Payer: Self-pay | Admitting: Obstetrics and Gynecology

## 2013-06-16 DIAGNOSIS — Z1231 Encounter for screening mammogram for malignant neoplasm of breast: Secondary | ICD-10-CM

## 2013-06-19 ENCOUNTER — Ambulatory Visit (HOSPITAL_COMMUNITY): Payer: 59

## 2013-06-19 ENCOUNTER — Ambulatory Visit (HOSPITAL_COMMUNITY)
Admission: RE | Admit: 2013-06-19 | Discharge: 2013-06-19 | Disposition: A | Discharge status: Home / Self Care | Payer: 59 | Source: Ambulatory Visit | Attending: Obstetrics and Gynecology | Admitting: Obstetrics and Gynecology

## 2013-06-19 DIAGNOSIS — Z1231 Encounter for screening mammogram for malignant neoplasm of breast: Secondary | ICD-10-CM | POA: Insufficient documentation

## 2014-04-22 ENCOUNTER — Other Ambulatory Visit: Payer: Self-pay | Admitting: Dermatology

## 2014-04-22 ENCOUNTER — Other Ambulatory Visit: Payer: Self-pay | Admitting: Obstetrics and Gynecology

## 2014-04-23 LAB — CYTOLOGY - PAP

## 2014-05-04 ENCOUNTER — Other Ambulatory Visit (HOSPITAL_COMMUNITY): Payer: Self-pay | Admitting: Internal Medicine

## 2014-05-05 ENCOUNTER — Other Ambulatory Visit (HOSPITAL_COMMUNITY): Payer: Self-pay | Admitting: Internal Medicine

## 2014-05-05 DIAGNOSIS — Z13820 Encounter for screening for osteoporosis: Secondary | ICD-10-CM

## 2014-05-05 DIAGNOSIS — M1991 Primary osteoarthritis, unspecified site: Secondary | ICD-10-CM

## 2014-05-08 ENCOUNTER — Ambulatory Visit (HOSPITAL_COMMUNITY)
Admission: RE | Admit: 2014-05-08 | Discharge: 2014-05-08 | Disposition: A | Discharge status: Home / Self Care | Payer: 59 | Source: Ambulatory Visit | Attending: Internal Medicine | Admitting: Internal Medicine

## 2014-05-08 DIAGNOSIS — M1991 Primary osteoarthritis, unspecified site: Secondary | ICD-10-CM | POA: Insufficient documentation

## 2014-05-08 DIAGNOSIS — Z13820 Encounter for screening for osteoporosis: Secondary | ICD-10-CM

## 2015-01-07 ENCOUNTER — Other Ambulatory Visit (HOSPITAL_COMMUNITY): Payer: Self-pay | Admitting: Obstetrics and Gynecology

## 2015-01-07 DIAGNOSIS — Z1231 Encounter for screening mammogram for malignant neoplasm of breast: Secondary | ICD-10-CM

## 2015-01-13 ENCOUNTER — Ambulatory Visit (HOSPITAL_COMMUNITY)
Admission: RE | Admit: 2015-01-13 | Discharge: 2015-01-13 | Disposition: A | Discharge status: Home / Self Care | Payer: 59 | Source: Ambulatory Visit | Attending: Obstetrics and Gynecology | Admitting: Obstetrics and Gynecology

## 2015-01-13 DIAGNOSIS — Z1231 Encounter for screening mammogram for malignant neoplasm of breast: Secondary | ICD-10-CM | POA: Diagnosis present

## 2015-08-11 ENCOUNTER — Other Ambulatory Visit: Payer: Self-pay | Admitting: Dermatology

## 2015-08-11 DIAGNOSIS — C4492 Squamous cell carcinoma of skin, unspecified: Secondary | ICD-10-CM

## 2015-08-11 HISTORY — DX: Squamous cell carcinoma of skin, unspecified: C44.92

## 2015-11-13 ENCOUNTER — Ambulatory Visit (INDEPENDENT_AMBULATORY_CARE_PROVIDER_SITE_OTHER): Payer: 59 | Admitting: Emergency Medicine

## 2015-11-13 ENCOUNTER — Ambulatory Visit (INDEPENDENT_AMBULATORY_CARE_PROVIDER_SITE_OTHER): Payer: 59

## 2015-11-13 VITALS — BP 118/70 | HR 82 | Temp 98.9°F | Resp 16 | Ht 68.0 in | Wt 211.0 lb

## 2015-11-13 DIAGNOSIS — R059 Cough, unspecified: Secondary | ICD-10-CM

## 2015-11-13 DIAGNOSIS — J029 Acute pharyngitis, unspecified: Secondary | ICD-10-CM | POA: Diagnosis not present

## 2015-11-13 DIAGNOSIS — R05 Cough: Secondary | ICD-10-CM

## 2015-11-13 LAB — POCT CBC
GRANULOCYTE PERCENT: 70.9 % (ref 37–80)
HEMATOCRIT: 39 % (ref 37.7–47.9)
Hemoglobin: 13.7 g/dL (ref 12.2–16.2)
Lymph, poc: 1.8 (ref 0.6–3.4)
MCH, POC: 29.6 pg (ref 27–31.2)
MCHC: 35 g/dL (ref 31.8–35.4)
MCV: 84.5 fL (ref 80–97)
MID (CBC): 0.4 (ref 0–0.9)
MPV: 7.4 fL (ref 0–99.8)
PLATELET COUNT, POC: 235 10*3/uL (ref 142–424)
POC GRANULOCYTE: 5.2 (ref 2–6.9)
POC LYMPH %: 24.3 % (ref 10–50)
POC MID %: 4.8 %M (ref 0–12)
RBC: 4.61 M/uL (ref 4.04–5.48)
RDW, POC: 13.2 %
WBC: 7.4 10*3/uL (ref 4.6–10.2)

## 2015-11-13 LAB — POCT RAPID STREP A (OFFICE): Rapid Strep A Screen: NEGATIVE

## 2015-11-13 MED ORDER — AZITHROMYCIN 250 MG PO TABS: ORAL_TABLET | ORAL | 0 refills | Status: DC

## 2015-11-13 MED ORDER — BENZONATATE 100 MG PO CAPS: 100.0000 mg | ORAL_CAPSULE | Freq: Three times a day (TID) | ORAL | 0 refills | Status: DC | PRN

## 2015-11-13 NOTE — Progress Notes (Signed)
By signing my name below, I, Moises Blood, attest that this documentation has been prepared under the direction and in the presence of Arlyss Queen, MD. Electronically Signed: Moises Blood, [place]. 11/13/2015 , 2:41 PM .  Patient was seen in room 4 .  Chief Complaint:  Chief Complaint  Patient presents with  . Other    chest congestion x 3day   . Sore Throat    HPI: Ana Williams is a 60 y.o. female who reports to [facility] today complaining of chest congestion and sore throat with voice loss for the past 3 days. She mentions feeling achy and waking up at night with cough. She also reports having a low grade fever (tmax 99). She's been taking dayquil, robitussin, and tylenol to be able to work. However, she is planning a trip going to the beach for a few days of vacation soon. She denies coughing up any sputum. She denies history of smoking. She denies history of asthma. She denies any known sick contact at work or at home. She denies seasonal allergies.   She lives alone at home.  Her father passed away with lung cancer; he was a heavy smoker.   No past medical history on file. Past Surgical History:  Procedure Laterality Date  . ABDOMINAL HYSTERECTOMY    . BACK SURGERY    . OOPHORECTOMY     Social History   Social History  . Marital status: Divorced    Spouse name: N/A  . Number of children: N/A  . Years of education: N/A   Social History Main Topics  . Smoking status: Never Smoker  . Smokeless tobacco: Never Used  . Alcohol use No  . Drug use: No  . Sexual activity: Yes    Birth control/ protection: Surgical   Other Topics Concern  . None   Social History Narrative  . None   No family history on file. Allergies  Allergen Reactions  . Penicillins     Childhood allergy  . Sulfa Antibiotics Nausea And Vomiting   Prior to Admission medications   Not on File     ROS:  Constitutional: negative for chills, night sweats, weight changes; positive for  fatigue, fever HEENT: negative for vision changes, hearing loss, rhinorrhea, epistaxis, or sinus pressure; positive for sore throat, congestion, voice loss Cardiovascular: negative for chest pain or palpitations Respiratory: negative for hemoptysis, wheezing, shortness of breath; positive for cough Abdominal: negative for abdominal pain, nausea, vomiting, diarrhea, or constipation Dermatological: negative for rash Musc: positive for myalgia Neurologic: negative for headache, dizziness, or syncope All other systems reviewed and are otherwise negative with the exception to those above and in the HPI.  PHYSICAL EXAM: Vitals:   11/13/15 1414  BP: 118/70  Pulse: 82  Resp: 16  Temp: 98.9 F (37.2 C)   Body mass index is 32.08 kg/m.   General: Alert, no acute distress HEENT:  Normocephalic, atraumatic, oropharynx patent; raspy voice, ears normal, mild nasal congestion, throat is red Eye: EOMI, PEERLDC Cardiovascular:  Regular rate and rhythm, no rubs murmurs or gallops.  No Carotid bruits, radial pulse intact. No pedal edema.  Respiratory: Clear to auscultation bilaterally.  No wheezes, rales, or rhonchi.  No cyanosis, no use of accessory musculature Abdominal: No organomegaly, abdomen is soft and non-tender, positive bowel sounds. No masses. Musculoskeletal: Gait intact. No edema, tenderness Skin: No rashes. Neurologic: Facial musculature symmetric. Psychiatric: Patient acts appropriately throughout our interaction.  Lymphatic: No cervical or submandibular lymphadenopathy Genitourinary/Anorectal: No  acute findings  LABS: Results for orders placed or performed in visit on 11/13/15  POCT CBC  Result Value Ref Range   WBC 7.4 4.6 - 10.2 K/uL   Lymph, poc 1.8 0.6 - 3.4   POC LYMPH PERCENT 24.3 10 - 50 %L   MID (cbc) 0.4 0 - 0.9   POC MID % 4.8 0 - 12 %M   POC Granulocyte 5.2 2 - 6.9   Granulocyte percent 70.9 37 - 80 %G   RBC 4.61 4.04 - 5.48 M/uL   Hemoglobin 13.7 12.2 - 16.2  g/dL   HCT, POC 39.0 37.7 - 47.9 %   MCV 84.5 80 - 97 fL   MCH, POC 29.6 27 - 31.2 pg   MCHC 35.0 31.8 - 35.4 g/dL   RDW, POC 13.2 %   Platelet Count, POC 235 142 - 424 K/uL   MPV 7.4 0 - 99.8 fL  POCT rapid strep A  Result Value Ref Range   Rapid Strep A Screen Negative Negative   Meds ordered this encounter  Medications  . benzonatate (TESSALON) 100 MG capsule    Sig: Take 1-2 capsules (100-200 mg total) by mouth 3 (three) times daily as needed for cough.    Dispense:  40 capsule    Refill:  0  . azithromycin (ZITHROMAX) 250 MG tablet    Sig: Take 2 tabs PO x 1 dose, then 1 tab PO QD x 4 days    Dispense:  6 tablet    Refill:  0    EKG/XRAY:   Dg Chest 2 View  Result Date: 11/13/2015 CLINICAL DATA:  60 year old female with cough and congestion for 1 week. EXAM: CHEST  2 VIEW COMPARISON:  None. FINDINGS: The cardiomediastinal silhouette is unremarkable. There is no evidence of focal airspace disease, pulmonary edema, suspicious pulmonary nodule/mass, pleural effusion, or pneumothorax. No acute bony abnormalities are identified. IMPRESSION: No active cardiopulmonary disease. Electronically Signed   By: Margarette Canada M.D.   On: 11/13/2015 14:57     ASSESSMENT/PLAN: This appears to be a viral type illness. She will be treated with Tessalon Perles fluids and rest. She was given a prescription for Z-Pak carried to the beach and if no better in 48-72 hours she can get this filled.I personally performed the services described in this documentation, which was scribed in my presence. The recorded information has been reviewed and is accurate.  Gross sideeffects, risk and benefits, and alternatives of medications d/w patient. Patient is aware that all medications have potential sideeffects and we are unable to predict every sideeffect or drug-drug interaction that may occur.  Arlyss Queen MD 11/13/2015 2:34 PM

## 2015-11-13 NOTE — Patient Instructions (Addendum)
     IF you received an x-ray today, you will receive an invoice from [facility]. Please contact  Radiology at [phone] with questions or concerns regarding your invoice.   IF you received labwork today, you will receive an invoice from [facility]. Please contact [facility] at [phone] with questions or concerns regarding your invoice.   Our billing staff will not be able to assist you with questions regarding bills from these companies.  You will be contacted with the lab results as soon as they are available. The fastest way to get your results is to activate your My Chart account. Instructions are located on the last page of this paperwork. If you have not heard from us regarding the results in 2 weeks, please contact this office.      Cough, Adult Coughing is a reflex that clears your throat and your airways. Coughing helps to heal and protect your lungs. It is normal to cough occasionally, but a cough that happens with other symptoms or lasts a long time may be a sign of a condition that needs treatment. A cough may last only 2-3 weeks (acute), or it may last longer than 8 weeks (chronic). CAUSES Coughing is commonly caused by:  Breathing in substances that irritate your lungs.  A viral or bacterial respiratory infection.  Allergies.  Asthma.  Postnasal drip.  Smoking.  Acid backing up from the stomach into the esophagus (gastroesophageal reflux).  Certain medicines.  Chronic lung problems, including COPD (or rarely, lung cancer).  Other medical conditions such as heart failure. HOME CARE INSTRUCTIONS  Pay attention to any changes in your symptoms. Take these actions to help with your discomfort:  Take medicines only as told by your health care provider.  If you were prescribed an antibiotic medicine, take it as told by your health care provider. Do not stop taking the antibiotic even if you start to feel  better.  Talk with your health care provider before you take a cough suppressant medicine.  Drink enough fluid to keep your urine clear or pale yellow.  If the air is dry, use a cold steam vaporizer or humidifier in your bedroom or your home to help loosen secretions.  Avoid anything that causes you to cough at work or at home.  If your cough is worse at night, try sleeping in a semi-upright position.  Avoid cigarette smoke. If you smoke, quit smoking. If you need help quitting, ask your health care provider.  Avoid caffeine.  Avoid alcohol.  Rest as needed. SEEK MEDICAL CARE IF:   You have new symptoms.  You cough up pus.  Your cough does not get better after 2-3 weeks, or your cough gets worse.  You cannot control your cough with suppressant medicines and you are losing sleep.  You develop pain that is getting worse or pain that is not controlled with pain medicines.  You have a fever.  You have unexplained weight loss.  You have night sweats. SEEK IMMEDIATE MEDICAL CARE IF:  You cough up blood.  You have difficulty breathing.  Your heartbeat is very fast.   This information is not intended to replace advice given to you by your health care provider. Make sure you discuss any questions you have with your health care provider.   Document Released: 08/26/2010 Document Revised: 11/18/2014 Document Reviewed: 05/06/2014 Elsevier Interactive Patient Education 2016 [employer] Inc.  

## 2015-12-16 ENCOUNTER — Other Ambulatory Visit (HOSPITAL_COMMUNITY): Payer: Self-pay | Admitting: Internal Medicine

## 2015-12-16 DIAGNOSIS — Z1231 Encounter for screening mammogram for malignant neoplasm of breast: Secondary | ICD-10-CM

## 2016-01-17 ENCOUNTER — Ambulatory Visit (HOSPITAL_COMMUNITY)
Admission: RE | Admit: 2016-01-17 | Discharge: 2016-01-17 | Disposition: A | Discharge status: Home / Self Care | Payer: 59 | Source: Ambulatory Visit | Attending: Internal Medicine | Admitting: Internal Medicine

## 2016-01-17 DIAGNOSIS — Z1231 Encounter for screening mammogram for malignant neoplasm of breast: Secondary | ICD-10-CM | POA: Insufficient documentation

## 2016-04-09 DIAGNOSIS — G4733 Obstructive sleep apnea (adult) (pediatric): Secondary | ICD-10-CM | POA: Diagnosis not present

## 2016-04-28 DIAGNOSIS — G4733 Obstructive sleep apnea (adult) (pediatric): Secondary | ICD-10-CM | POA: Diagnosis not present

## 2016-05-10 DIAGNOSIS — G4733 Obstructive sleep apnea (adult) (pediatric): Secondary | ICD-10-CM | POA: Diagnosis not present

## 2016-06-10 DIAGNOSIS — G4733 Obstructive sleep apnea (adult) (pediatric): Secondary | ICD-10-CM | POA: Diagnosis not present

## 2016-07-10 DIAGNOSIS — G4733 Obstructive sleep apnea (adult) (pediatric): Secondary | ICD-10-CM | POA: Diagnosis not present

## 2016-07-28 DIAGNOSIS — T07XXXA Unspecified multiple injuries, initial encounter: Secondary | ICD-10-CM | POA: Diagnosis not present

## 2016-08-10 DIAGNOSIS — G4733 Obstructive sleep apnea (adult) (pediatric): Secondary | ICD-10-CM | POA: Diagnosis not present

## 2016-08-28 DIAGNOSIS — H43813 Vitreous degeneration, bilateral: Secondary | ICD-10-CM | POA: Diagnosis not present

## 2016-08-28 DIAGNOSIS — H31093 Other chorioretinal scars, bilateral: Secondary | ICD-10-CM | POA: Diagnosis not present

## 2016-09-09 DIAGNOSIS — G4733 Obstructive sleep apnea (adult) (pediatric): Secondary | ICD-10-CM | POA: Diagnosis not present

## 2016-10-10 DIAGNOSIS — G4733 Obstructive sleep apnea (adult) (pediatric): Secondary | ICD-10-CM | POA: Diagnosis not present

## 2016-11-10 DIAGNOSIS — G4733 Obstructive sleep apnea (adult) (pediatric): Secondary | ICD-10-CM | POA: Diagnosis not present

## 2016-12-10 DIAGNOSIS — G4733 Obstructive sleep apnea (adult) (pediatric): Secondary | ICD-10-CM | POA: Diagnosis not present

## 2016-12-27 DIAGNOSIS — Z0000 Encounter for general adult medical examination without abnormal findings: Secondary | ICD-10-CM | POA: Diagnosis not present

## 2016-12-28 DIAGNOSIS — R739 Hyperglycemia, unspecified: Secondary | ICD-10-CM | POA: Diagnosis not present

## 2016-12-28 DIAGNOSIS — Z1389 Encounter for screening for other disorder: Secondary | ICD-10-CM | POA: Diagnosis not present

## 2016-12-28 DIAGNOSIS — Z0000 Encounter for general adult medical examination without abnormal findings: Secondary | ICD-10-CM | POA: Diagnosis not present

## 2017-01-10 DIAGNOSIS — G4733 Obstructive sleep apnea (adult) (pediatric): Secondary | ICD-10-CM | POA: Diagnosis not present

## 2017-03-13 ENCOUNTER — Encounter (HOSPITAL_COMMUNITY): Payer: Self-pay | Admitting: *Deleted

## 2017-03-13 ENCOUNTER — Other Ambulatory Visit: Payer: Self-pay

## 2017-03-13 ENCOUNTER — Emergency Department (HOSPITAL_COMMUNITY)
Admission: EM | Admit: 2017-03-13 | Discharge: 2017-03-13 | Disposition: A | Discharge status: Home / Self Care | Payer: 59 | Attending: Emergency Medicine | Admitting: Emergency Medicine

## 2017-03-13 DIAGNOSIS — S61214A Laceration without foreign body of right ring finger without damage to nail, initial encounter: Secondary | ICD-10-CM | POA: Insufficient documentation

## 2017-03-13 DIAGNOSIS — W268XXA Contact with other sharp object(s), not elsewhere classified, initial encounter: Secondary | ICD-10-CM | POA: Insufficient documentation

## 2017-03-13 DIAGNOSIS — Y999 Unspecified external cause status: Secondary | ICD-10-CM | POA: Insufficient documentation

## 2017-03-13 DIAGNOSIS — Y939 Activity, unspecified: Secondary | ICD-10-CM | POA: Diagnosis not present

## 2017-03-13 DIAGNOSIS — Z23 Encounter for immunization: Secondary | ICD-10-CM | POA: Insufficient documentation

## 2017-03-13 DIAGNOSIS — Y929 Unspecified place or not applicable: Secondary | ICD-10-CM | POA: Insufficient documentation

## 2017-03-13 HISTORY — DX: Unspecified osteoarthritis, unspecified site: M19.90

## 2017-03-13 MED ORDER — TETANUS-DIPHTH-ACELL PERTUSSIS 5-2.5-18.5 LF-MCG/0.5 IM SUSP
0.5000 mL | Freq: Once | INTRAMUSCULAR | Status: AC
  Administered 2017-03-13: 0.5 mL via INTRAMUSCULAR
  Filled 2017-03-13: qty 0.5

## 2017-03-13 NOTE — Discharge Instructions (Signed)
Keep the finger clean and dry as possible.  Keep it bandaged while at work.  The Dermabond should begin to peel off in a week or so.  Return to the ER for any signs of infection.

## 2017-03-13 NOTE — ED Triage Notes (Signed)
Right ring finger laceration, cut on a can top

## 2017-03-13 NOTE — ED Provider Notes (Signed)
[facility] EMERGENCY DEPARTMENT Provider Note   CSN: [id number] Arrival date & time: 03/13/17  3818     History   Chief Complaint Chief Complaint  Patient presents with  . Laceration    HPI Ana Williams is a 62 y.o. female.  HPI   Ana Williams is a 62 y.o. female who presents to the Emergency Department complaining of laceration to the tip of the right ring finger.  She states that she was pushing trash down in the trash bag when she accidentally cut her finger on the edge of a metal can.  She states she cleaned the wound with tap water and control bleeding with direct pressure.  She reports mild "soreness" to the tip of her finger associated with movement or palpation.  She denies numbness, swelling, or history of bleeding disorders.  She does not use blood thinners.  Last tetanus vaccine is unknown.   Past Medical History:  Diagnosis Date  . Arthritis     Patient Active Problem List   Diagnosis Date Noted  . Chest pain 10/15/2012  . Weight gain 10/15/2012  . GERD (gastroesophageal reflux disease) 10/14/2012  . Basal cell carcinoma 10/14/2012  . Diverticulosis 10/14/2012  . Costochondritis 10/14/2012  . Gait abnormality 12/07/2011  . 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 Acute meniscal tear of knee 08/24/2011    Past Surgical History:  Procedure Laterality Date  . ABDOMINAL HYSTERECTOMY    . BACK SURGERY    . OOPHORECTOMY      OB History    No data available       Home Medications    Prior to Admission medications   Medication Sig Start Date End Date Taking? Authorizing Provider  azithromycin (ZITHROMAX) 250 MG tablet Take 2 tabs PO x 1 dose, then 1 tab PO QD x 4 days 11/13/15   Darlyne Russian, MD  benzonatate (TESSALON) 100 MG capsule Take 1-2 capsules (100-200 mg total) by mouth 3 (three) times daily as needed for cough. 11/13/15   Darlyne Russian, MD    Family History No family history on file.  Social History Social History    Tobacco Use  . Smoking status: Never Smoker  . Smokeless tobacco: Never Used  Substance Use Topics  . Alcohol use: No  . Drug use: No     Allergies   Penicillins and Sulfa antibiotics   Review of Systems Review of Systems  Constitutional: Negative for chills and fever.  Musculoskeletal: Negative for arthralgias, back pain and joint swelling.  Skin: Positive for wound.       Laceration   Neurological: Negative for dizziness, weakness and numbness.  Hematological: Does not bruise/bleed easily.  All other systems reviewed and are negative.    Physical Exam Updated Vital Signs BP (!) 150/78   Pulse 93   Temp 98.6 F (37 C)   Resp 18   Ht 5\' 9"  (1.753 m)   Wt 92.1 kg (203 lb)   SpO2 99%   BMI 29.98 kg/m   Physical Exam  Constitutional: She is oriented to person, place, and time. She appears well-developed and well-nourished. No distress.  HENT:  Head: Normocephalic and atraumatic.  Cardiovascular: Normal rate, regular rhythm and intact distal pulses.  No murmur heard. Pulmonary/Chest: Effort normal and breath sounds normal. No respiratory distress.  Musculoskeletal: She exhibits no edema or tenderness.  Patient has full range of motion of the right ring finger.  No edema.  Neurological: She is alert  and oriented to person, place, and time. No sensory deficit. She exhibits normal muscle tone. Coordination normal.  Skin: Skin is warm. Capillary refill takes less than 2 seconds. Laceration noted.  1 cm superficial laceration to the radial aspect of the distal right ring finger.  Bleeding controlled.  No edema.  Nail is intact.  Nursing note and vitals reviewed.    ED Treatments / Results  Labs (all labs ordered are listed, but only abnormal results are displayed) Labs Reviewed - No data to display  EKG  EKG Interpretation None       Radiology No results found.  Procedures Procedures (including critical care time)  LACERATION REPAIR Performed by:  Jeanise Durfey L. Authorized by: Hale Bogus Consent: Verbal consent obtained. Risks and benefits: risks, benefits and alternatives were discussed Consent given by: patient Patient identity confirmed: provided demographic data Prepped and Draped in normal sterile fashion Wound explored  Laceration Location: Right ring finger  Laceration Length: 1 cm  No Foreign Bodies seen or palpated  Anesthesia: None  Irrigation method: syringe Amount of cleaning: standard  Skin closure: Dermabond  Technique: Topical application  Patient tolerance: Patient tolerated the procedure well with no immediate complications.   Medications Ordered in ED Medications  Tdap (BOOSTRIX) injection 0.5 mL (not administered)     Initial Impression / Assessment and Plan / ED Course  I have reviewed the triage vital signs and the nursing notes.  Pertinent labs & imaging results that were available during my care of the patient were reviewed by me and considered in my medical decision making (see chart for details).     Patient with superficial laceration to the distal ring finger.  Wound cleaned with saline and bleeding controlled prior to closure. TD updated.   No involvement of the nail.  Neurovascularly intact. No motor deficits  Wound bandaged.  Care instructions discussed.   Final Clinical Impressions(s) / ED Diagnoses   Final diagnoses:  Laceration of right ring finger without foreign body without damage to nail, initial encounter    ED Discharge Orders    None       Kem Parkinson, PA-C 03/13/17 1046    Isla Pence, MD 03/13/17 1321

## 2017-07-23 ENCOUNTER — Other Ambulatory Visit (HOSPITAL_COMMUNITY): Payer: Self-pay | Admitting: Internal Medicine

## 2017-07-23 DIAGNOSIS — Z1231 Encounter for screening mammogram for malignant neoplasm of breast: Secondary | ICD-10-CM

## 2017-08-20 ENCOUNTER — Ambulatory Visit (HOSPITAL_COMMUNITY)
Admission: RE | Admit: 2017-08-20 | Discharge: 2017-08-20 | Disposition: A | Discharge status: Home / Self Care | Payer: 59 | Source: Ambulatory Visit | Attending: Internal Medicine | Admitting: Internal Medicine

## 2017-08-20 ENCOUNTER — Encounter (HOSPITAL_COMMUNITY): Payer: Self-pay

## 2017-08-20 DIAGNOSIS — H2513 Age-related nuclear cataract, bilateral: Secondary | ICD-10-CM | POA: Diagnosis not present

## 2017-08-20 DIAGNOSIS — H04123 Dry eye syndrome of bilateral lacrimal glands: Secondary | ICD-10-CM | POA: Diagnosis not present

## 2017-08-20 DIAGNOSIS — Z1231 Encounter for screening mammogram for malignant neoplasm of breast: Secondary | ICD-10-CM | POA: Diagnosis present

## 2017-08-20 DIAGNOSIS — H35419 Lattice degeneration of retina, unspecified eye: Secondary | ICD-10-CM | POA: Diagnosis not present

## 2017-08-21 DIAGNOSIS — Z01419 Encounter for gynecological examination (general) (routine) without abnormal findings: Secondary | ICD-10-CM | POA: Diagnosis not present

## 2017-08-22 DIAGNOSIS — D229 Melanocytic nevi, unspecified: Secondary | ICD-10-CM | POA: Diagnosis not present

## 2017-08-22 DIAGNOSIS — L821 Other seborrheic keratosis: Secondary | ICD-10-CM | POA: Diagnosis not present

## 2017-08-23 DIAGNOSIS — R7309 Other abnormal glucose: Secondary | ICD-10-CM | POA: Diagnosis not present

## 2018-04-23 ENCOUNTER — Other Ambulatory Visit (HOSPITAL_COMMUNITY): Payer: Self-pay | Admitting: Family Medicine

## 2018-04-23 ENCOUNTER — Ambulatory Visit (HOSPITAL_COMMUNITY)
Admission: RE | Admit: 2018-04-23 | Discharge: 2018-04-23 | Disposition: A | Discharge status: Home / Self Care | Payer: 59 | Source: Ambulatory Visit | Attending: Family Medicine | Admitting: Family Medicine

## 2018-04-23 DIAGNOSIS — S99922A Unspecified injury of left foot, initial encounter: Secondary | ICD-10-CM | POA: Diagnosis not present

## 2018-04-23 DIAGNOSIS — Z6832 Body mass index (BMI) 32.0-32.9, adult: Secondary | ICD-10-CM | POA: Diagnosis not present

## 2018-04-23 DIAGNOSIS — M79672 Pain in left foot: Secondary | ICD-10-CM | POA: Insufficient documentation

## 2018-04-23 DIAGNOSIS — M7989 Other specified soft tissue disorders: Secondary | ICD-10-CM | POA: Diagnosis not present

## 2018-04-23 DIAGNOSIS — E6609 Other obesity due to excess calories: Secondary | ICD-10-CM | POA: Diagnosis not present

## 2018-05-01 DIAGNOSIS — K219 Gastro-esophageal reflux disease without esophagitis: Secondary | ICD-10-CM | POA: Diagnosis not present

## 2018-05-27 DIAGNOSIS — K219 Gastro-esophageal reflux disease without esophagitis: Secondary | ICD-10-CM | POA: Diagnosis not present

## 2018-05-27 DIAGNOSIS — D125 Benign neoplasm of sigmoid colon: Secondary | ICD-10-CM | POA: Diagnosis not present

## 2018-05-27 DIAGNOSIS — Z1211 Encounter for screening for malignant neoplasm of colon: Secondary | ICD-10-CM | POA: Diagnosis not present

## 2018-05-27 DIAGNOSIS — K209 Esophagitis, unspecified: Secondary | ICD-10-CM | POA: Diagnosis not present

## 2018-05-27 DIAGNOSIS — Z800 Family history of malignant neoplasm of digestive organs: Secondary | ICD-10-CM | POA: Diagnosis not present

## 2018-08-06 DIAGNOSIS — E6609 Other obesity due to excess calories: Secondary | ICD-10-CM | POA: Diagnosis not present

## 2018-08-06 DIAGNOSIS — R1032 Left lower quadrant pain: Secondary | ICD-10-CM | POA: Diagnosis not present

## 2018-08-06 DIAGNOSIS — K5792 Diverticulitis of intestine, part unspecified, without perforation or abscess without bleeding: Secondary | ICD-10-CM | POA: Diagnosis not present

## 2018-08-27 ENCOUNTER — Other Ambulatory Visit: Payer: Self-pay | Admitting: Gastroenterology

## 2018-08-27 DIAGNOSIS — R1032 Left lower quadrant pain: Secondary | ICD-10-CM

## 2018-09-04 ENCOUNTER — Other Ambulatory Visit (HOSPITAL_COMMUNITY): Payer: Self-pay | Admitting: Internal Medicine

## 2018-09-04 DIAGNOSIS — Z1231 Encounter for screening mammogram for malignant neoplasm of breast: Secondary | ICD-10-CM

## 2018-09-11 ENCOUNTER — Ambulatory Visit (HOSPITAL_COMMUNITY)
Admission: RE | Admit: 2018-09-11 | Discharge: 2018-09-11 | Disposition: A | Discharge status: Home / Self Care | Payer: 59 | Source: Ambulatory Visit | Attending: Internal Medicine | Admitting: Internal Medicine

## 2018-09-11 ENCOUNTER — Other Ambulatory Visit: Payer: Self-pay

## 2018-09-11 DIAGNOSIS — Z1231 Encounter for screening mammogram for malignant neoplasm of breast: Secondary | ICD-10-CM | POA: Insufficient documentation

## 2018-09-12 ENCOUNTER — Ambulatory Visit
Admission: RE | Admit: 2018-09-12 | Discharge: 2018-09-12 | Disposition: A | Discharge status: Home / Self Care | Payer: 59 | Source: Ambulatory Visit | Attending: Gastroenterology | Admitting: Gastroenterology

## 2018-09-12 DIAGNOSIS — R1032 Left lower quadrant pain: Secondary | ICD-10-CM

## 2018-09-12 MED ORDER — IOPAMIDOL (ISOVUE-300) INJECTION 61%
100.0000 mL | Freq: Once | INTRAVENOUS | Status: AC | PRN
  Administered 2018-09-12: 100 mL via INTRAVENOUS

## 2019-07-28 ENCOUNTER — Other Ambulatory Visit: Payer: Self-pay

## 2019-07-28 ENCOUNTER — Encounter: Payer: Self-pay | Admitting: Dermatology

## 2019-07-28 ENCOUNTER — Ambulatory Visit: Payer: 59 | Admitting: Dermatology

## 2019-07-28 DIAGNOSIS — L738 Other specified follicular disorders: Secondary | ICD-10-CM | POA: Diagnosis not present

## 2019-07-28 DIAGNOSIS — D1801 Hemangioma of skin and subcutaneous tissue: Secondary | ICD-10-CM

## 2019-07-28 DIAGNOSIS — Z1283 Encounter for screening for malignant neoplasm of skin: Secondary | ICD-10-CM

## 2019-07-28 DIAGNOSIS — D229 Melanocytic nevi, unspecified: Secondary | ICD-10-CM

## 2019-07-28 DIAGNOSIS — Z85828 Personal history of other malignant neoplasm of skin: Secondary | ICD-10-CM | POA: Diagnosis not present

## 2019-07-28 DIAGNOSIS — L821 Other seborrheic keratosis: Secondary | ICD-10-CM

## 2019-07-28 DIAGNOSIS — D225 Melanocytic nevi of trunk: Secondary | ICD-10-CM

## 2019-07-28 NOTE — Patient Instructions (Addendum)
Routine general skin follow-up and almost 40-year anniversary for following Ana Williams.  She has a history of several nonmelanoma skin cancers including one facial Mohs surgery.  There is no sign of recurrent or new nonmelanoma skin cancer.  There are no atypical moles.  She has benign keratoses on her left upper back, left temple, and flat ones on the arms.  There is a nonspecific 49mm pink spot on the right shin which can safely be watched.  There is a small angioma below the left breast.  There is 3 mm umbilicated cream-colored papule on the left inner cheek which is typical of sebaceous hyperplasia; I explained that this may mimic the appearance of a basal cell skin cancer but does not require specific watching or biopsy.  Routine follow-up in 1 year unless Jori sees something that requires earlier intervention.

## 2019-08-01 NOTE — Progress Notes (Signed)
   Follow-Up Visit   Subjective  Ana Williams is a 64 y.o. female who presents for the following: Annual Exam (left cheek--scaly x mths, right inner leg- red place, back Left shoulder- scaly, couple places that come up on arm).  Scaly growths Location: Left temple and back Duration: Avril months Quality:  Associated Signs/Symptoms: Modifying Factors:  Severity:  Timing: Context: History of non-mole skin cancers  The following portions of the chart were reviewed this encounter and updated as appropriate:     Objective  Well appearing patient in no apparent distress; mood and affect are within normal limits.  A full examination was performed including scalp, head, eyes, ears, nose, lips, neck, chest, axillae, abdomen, back, buttocks, bilateral upper extremities, bilateral lower extremities, hands, feet, fingers, toes, fingernails, and toenails. All findings within normal limits unless otherwise noted below.   Assessment & Plan  Nevus Mid Back  Personal history of skin cancer Nose  Seborrheic keratosis (2) Left Upper Back; Left Temple   Routine general skin follow-up and almost 40-year anniversary for following Arrie Senate.  She has a history of several nonmelanoma skin cancers including one facial Mohs surgery.  There is no sign of recurrent or new nonmelanoma skin cancer.  There are no atypical moles.  She has benign keratoses on her left upper back, left temple, and flat ones on the arms.  There is a nonspecific 62mm pink spot on the right shin which can safely be watched.  There is a small angioma below the left breast.  There is 3 mm umbilicated cream-colored papule on the left inner cheek which is typical of sebaceous hyperplasia; I explained that this may mimic the appearance of a basal cell skin cancer but does not require specific watching or biopsy.  Routine follow-up in 1 year unless Lashaye sees something that requires earlier intervention. Skin cancer screening  performed today.

## 2019-11-06 ENCOUNTER — Other Ambulatory Visit (HOSPITAL_COMMUNITY): Payer: Self-pay | Admitting: Internal Medicine

## 2019-11-06 DIAGNOSIS — Z1231 Encounter for screening mammogram for malignant neoplasm of breast: Secondary | ICD-10-CM

## 2019-11-24 ENCOUNTER — Other Ambulatory Visit: Payer: Self-pay

## 2019-11-24 ENCOUNTER — Ambulatory Visit (HOSPITAL_COMMUNITY)
Admission: RE | Admit: 2019-11-24 | Discharge: 2019-11-24 | Disposition: A | Discharge status: Home / Self Care | Payer: 59 | Source: Ambulatory Visit | Attending: Internal Medicine | Admitting: Internal Medicine

## 2019-11-24 DIAGNOSIS — Z1231 Encounter for screening mammogram for malignant neoplasm of breast: Secondary | ICD-10-CM

## 2020-06-23 ENCOUNTER — Other Ambulatory Visit: Payer: Self-pay

## 2020-06-23 ENCOUNTER — Encounter: Payer: Self-pay | Admitting: Dermatology

## 2020-06-23 ENCOUNTER — Ambulatory Visit: Payer: 59 | Admitting: Dermatology

## 2020-06-23 DIAGNOSIS — L821 Other seborrheic keratosis: Secondary | ICD-10-CM

## 2020-06-23 DIAGNOSIS — L820 Inflamed seborrheic keratosis: Secondary | ICD-10-CM | POA: Diagnosis not present

## 2020-06-23 DIAGNOSIS — Z86018 Personal history of other benign neoplasm: Secondary | ICD-10-CM | POA: Diagnosis not present

## 2020-06-23 DIAGNOSIS — Z85828 Personal history of other malignant neoplasm of skin: Secondary | ICD-10-CM | POA: Diagnosis not present

## 2020-06-23 DIAGNOSIS — D485 Neoplasm of uncertain behavior of skin: Secondary | ICD-10-CM

## 2020-06-23 DIAGNOSIS — Z1283 Encounter for screening for malignant neoplasm of skin: Secondary | ICD-10-CM

## 2020-06-23 DIAGNOSIS — L738 Other specified follicular disorders: Secondary | ICD-10-CM

## 2020-06-23 NOTE — Patient Instructions (Signed)

## 2020-07-01 ENCOUNTER — Telehealth: Payer: Self-pay | Admitting: Dermatology

## 2020-07-01 NOTE — Telephone Encounter (Signed)
Patient is calling for pathology results from last visit with Stuart Tafeen, MD 

## 2020-07-01 NOTE — Telephone Encounter (Signed)
PATH TO PATIENT

## 2020-07-03 NOTE — Progress Notes (Signed)
   Follow-Up Visit   Subjective  Ana Williams is a 65 y.o. female who presents for the following: Annual Exam (Full body skin check. Lesion on left cheek, flaky, per patient she picks it and it becomes tender, x months. Left forehead that feels the same as left cheek. Flaky spots on back x months, no itching, per patient just very noticeable. ).  General skin examination Location:  Duration:  Quality:  Associated Signs/Symptoms: Modifying Factors:  Severity:  Timing: Context:   Objective  Well appearing patient in no apparent distress; mood and affect are within normal limits. Objective  Left Nasal Sidewall, Right Upper Back: No sign recurrence  Objective  Left lower Abdomen (side) - Lower: No sign recurrence or other atypical moles  Objective  Left Lower Back, Left Shoulder - Posterior: Noninflamed textured 3 to 8 mm flattopped tan papules  Objective  Left Buccal Cheek: 2 mm flesh-colored papule with eccentric dell  Objective  Left Temporal Scalp: Pink-tan 8 mm gritty crust: I SK versus CIS.       A full examination was performed including scalp, head, eyes, ears, nose, lips, neck, chest, axillae, abdomen, back, buttocks, bilateral upper extremities, bilateral lower extremities, hands, feet, fingers, toes, fingernails, and toenails. All findings within normal limits unless otherwise noted below.  Areas beneath undergarments not fully examined.   Assessment & Plan    History of basal cell carcinoma (BCC) (2) Right Upper Back; Left Nasal Sidewall  Recheck as needed  History of atypical skin mole Left lower Abdomen (side) - Lower  Recheck as needed  Seborrheic keratosis (2) Left Shoulder - Posterior; Left Lower Back  Intervention not currently necessary  Sebaceous hyperplasia Left Buccal Cheek  Told her resemblance of an early basal cell so if there is growth or bleeding return for biopsy.  Neoplasm of uncertain behavior of skin Left Temporal  Scalp  Skin / nail biopsy Type of biopsy: tangential   Informed consent: discussed and consent obtained   Timeout: patient name, date of birth, surgical site, and procedure verified   Procedure prep:  Patient was prepped and draped in usual sterile fashion (Non sterile) Prep type:  Chlorhexidine Anesthesia: the lesion was anesthetized in a standard fashion   Anesthetic:  1% lidocaine w/ epinephrine 1-100,000 local infiltration Instrument used: flexible razor blade   Outcome: patient tolerated procedure well   Post-procedure details: wound care instructions given    Specimen 1 - Surgical pathology Differential Diagnosis: isk Cautery  Check Margins: No      I, Lavonna Monarch, MD, have reviewed all documentation for this visit.  The documentation on 07/03/20 for the exam, diagnosis, procedures, and orders are all accurate and complete.

## 2020-08-02 ENCOUNTER — Ambulatory Visit: Payer: 59 | Admitting: Dermatology

## 2020-10-25 ENCOUNTER — Other Ambulatory Visit (HOSPITAL_COMMUNITY): Payer: Self-pay | Admitting: Physician Assistant

## 2020-10-25 DIAGNOSIS — Z1231 Encounter for screening mammogram for malignant neoplasm of breast: Secondary | ICD-10-CM

## 2020-11-25 ENCOUNTER — Ambulatory Visit (HOSPITAL_COMMUNITY): Payer: 59

## 2020-12-06 ENCOUNTER — Ambulatory Visit (HOSPITAL_COMMUNITY)
Admission: RE | Admit: 2020-12-06 | Discharge: 2020-12-06 | Disposition: A | Discharge status: Home / Self Care | Payer: 59 | Source: Ambulatory Visit | Attending: Physician Assistant | Admitting: Physician Assistant

## 2020-12-06 ENCOUNTER — Other Ambulatory Visit: Payer: Self-pay

## 2020-12-06 DIAGNOSIS — Z1231 Encounter for screening mammogram for malignant neoplasm of breast: Secondary | ICD-10-CM | POA: Diagnosis present

## 2020-12-08 ENCOUNTER — Ambulatory Visit: Payer: 59 | Admitting: Physician Assistant

## 2021-03-02 ENCOUNTER — Ambulatory Visit: Payer: 59 | Admitting: Dermatology

## 2021-06-27 ENCOUNTER — Ambulatory Visit: Payer: 59 | Admitting: Dermatology

## 2021-06-27 ENCOUNTER — Encounter: Payer: Self-pay | Admitting: Dermatology

## 2021-06-27 DIAGNOSIS — L820 Inflamed seborrheic keratosis: Secondary | ICD-10-CM | POA: Diagnosis not present

## 2021-06-27 DIAGNOSIS — Z86018 Personal history of other benign neoplasm: Secondary | ICD-10-CM

## 2021-06-27 DIAGNOSIS — Z85828 Personal history of other malignant neoplasm of skin: Secondary | ICD-10-CM | POA: Diagnosis not present

## 2021-06-27 DIAGNOSIS — Z1283 Encounter for screening for malignant neoplasm of skin: Secondary | ICD-10-CM

## 2021-06-27 DIAGNOSIS — L570 Actinic keratosis: Secondary | ICD-10-CM | POA: Diagnosis not present

## 2021-06-27 DIAGNOSIS — D485 Neoplasm of uncertain behavior of skin: Secondary | ICD-10-CM

## 2021-06-27 NOTE — Patient Instructions (Addendum)
ASK PRIMARY CARE ABOUT WEIGHT LOSS INJECTIONS WEGOVY AND Mounjaro ? ? ?Biopsy, Surgery (Curettage) & Surgery (Excision) Aftercare Instructions ? ?1. Okay to remove bandage in 24 hours ? ?2. Wash area with soap and water ? ?3. Apply Vaseline to area twice daily until healed (Not Neosporin) ? ?4. Okay to cover with a Band-Aid to decrease the chance of infection or prevent irritation from clothing; also it's okay to uncover lesion at home. ? ?5. Suture instructions: return to our office in 7-10 or 10-14 days for a nurse visit for suture removal. Variable healing with sutures, if pain or itching occurs call our office. It's okay to shower or bathe 24 hours after sutures are given. ? ?6. The following risks may occur after a biopsy, curettage or excision: bleeding, scarring, discoloration, recurrence, infection (redness, yellow drainage, pain or swelling). ? ?7. For questions, concerns and results call our office at [facility] before 4pm & Friday before 3pm. Biopsy results will be available in 1 week. ? ?

## 2021-07-12 ENCOUNTER — Encounter: Payer: Self-pay | Admitting: Dermatology

## 2021-07-12 NOTE — Progress Notes (Signed)
? ?  Follow-Up Visit ?  ?Subjective  ?Ana Williams is a 66 y.o. female who presents for the following: Annual Exam (Wants 2 spots checked on breast, personal history of bcc scc and atypia ). ? ?General skin examination, new spots on chest and neck ?Location:  ?Duration:  ?Quality:  ?Associated Signs/Symptoms: ?Modifying Factors:  ?Severity:  ?Timing: ?Context:  ? ?Objective  ?Well appearing patient in no apparent distress; mood and affect are within normal limits. ?No atypical pigmented lesions (all checked with dermoscopy) noted at the time of the visit. Many keratosis on the torso and breast.  1 possible nonmelanoma skin cancer left neck will be biopsied. ? ?Left Anterior Neck ?5 mm waxy pink crust, rule out superficial carcinoma ? ? ? ? ?Neck - Anterior, Right Forearm - Anterior ?Gritty 4 mm pink crusts ? ? ? ?A full examination was performed including scalp, head, eyes, ears, nose, lips, neck, chest, axillae, abdomen, back, buttocks, bilateral upper extremities, bilateral lower extremities, hands, feet, fingers, toes, fingernails, and toenails. All findings within normal limits unless otherwise noted below.  Areas beneath undergarments not fully examined. ? ? ?Assessment & Plan  ? ? ?Screening exam for skin cancer ? ?Annual skin examination, continued ultraviolet protection. ? ?Neoplasm of uncertain behavior of skin ?Left Anterior Neck ? ?Skin / nail biopsy ?Type of biopsy: tangential   ?Informed consent: discussed and consent obtained   ?Timeout: patient name, date of birth, surgical site, and procedure verified   ?Anesthesia: the lesion was anesthetized in a standard fashion   ?Anesthetic:  1% lidocaine w/ epinephrine 1-100,000 local infiltration ?Instrument used: flexible razor blade   ?Hemostasis achieved with: aluminum chloride and electrodesiccation   ?Outcome: patient tolerated procedure well   ?Post-procedure details: wound care instructions given   ? ?Specimen 1 - Surgical pathology ?Differential  Diagnosis: BCC SCC CAUTERY ONLY ? ?Check Margins: No ? ?AK (actinic keratosis) (2) ?Neck - Anterior; Right Forearm - Anterior ? ?Destruction of lesion - Neck - Anterior, Right Forearm - Anterior ?Complexity: simple   ?Destruction method: cryotherapy   ?Informed consent: discussed and consent obtained   ?Timeout:  patient name, date of birth, surgical site, and procedure verified ?Lesion destroyed using liquid nitrogen: Yes   ?Cryotherapy cycles:  3 ?Outcome: patient tolerated procedure well with no complications   ?Post-procedure details: wound care instructions given   ? ? ? ? ? ?I, Lavonna Monarch, MD, have reviewed all documentation for this visit.  The documentation on 07/12/21 for the exam, diagnosis, procedures, and orders are all accurate and complete. ?

## 2021-12-20 ENCOUNTER — Other Ambulatory Visit (HOSPITAL_COMMUNITY): Payer: Self-pay | Admitting: Internal Medicine

## 2021-12-20 DIAGNOSIS — Z1231 Encounter for screening mammogram for malignant neoplasm of breast: Secondary | ICD-10-CM

## 2022-01-23 ENCOUNTER — Ambulatory Visit (HOSPITAL_COMMUNITY)
Admission: RE | Admit: 2022-01-23 | Discharge: 2022-01-23 | Disposition: A | Discharge status: Home / Self Care | Payer: 59 | Source: Ambulatory Visit | Attending: Internal Medicine | Admitting: Internal Medicine

## 2022-01-23 DIAGNOSIS — Z1231 Encounter for screening mammogram for malignant neoplasm of breast: Secondary | ICD-10-CM | POA: Insufficient documentation

## 2022-01-30 ENCOUNTER — Other Ambulatory Visit (HOSPITAL_COMMUNITY): Payer: Self-pay | Admitting: Internal Medicine

## 2022-01-30 ENCOUNTER — Encounter (HOSPITAL_COMMUNITY): Payer: Self-pay | Admitting: Internal Medicine

## 2022-01-30 DIAGNOSIS — R928 Other abnormal and inconclusive findings on diagnostic imaging of breast: Secondary | ICD-10-CM

## 2022-01-31 ENCOUNTER — Encounter (HOSPITAL_COMMUNITY): Payer: Self-pay

## 2022-01-31 ENCOUNTER — Ambulatory Visit (HOSPITAL_COMMUNITY)
Admission: RE | Admit: 2022-01-31 | Discharge: 2022-01-31 | Disposition: A | Discharge status: Home / Self Care | Payer: 59 | Source: Ambulatory Visit | Attending: Internal Medicine | Admitting: Internal Medicine

## 2022-01-31 DIAGNOSIS — R928 Other abnormal and inconclusive findings on diagnostic imaging of breast: Secondary | ICD-10-CM | POA: Diagnosis present

## 2022-04-23 IMAGING — MG MM DIGITAL SCREENING BILAT W/ TOMO AND CAD
8 series · 9 of 24 positions shown · non-contrast
Comparison: Previous exam(s).

CLINICAL DATA: Screening.

EXAM:
DIGITAL SCREENING BILATERAL MAMMOGRAM WITH TOMOSYNTHESIS AND CAD
TECHNIQUE: Bilateral screening digital craniocaudal and mediolateral oblique
mammograms were obtained. Bilateral screening digital breast
tomosynthesis was performed. The images were evaluated with
computer-aided detection.

[L CC synth-2D]
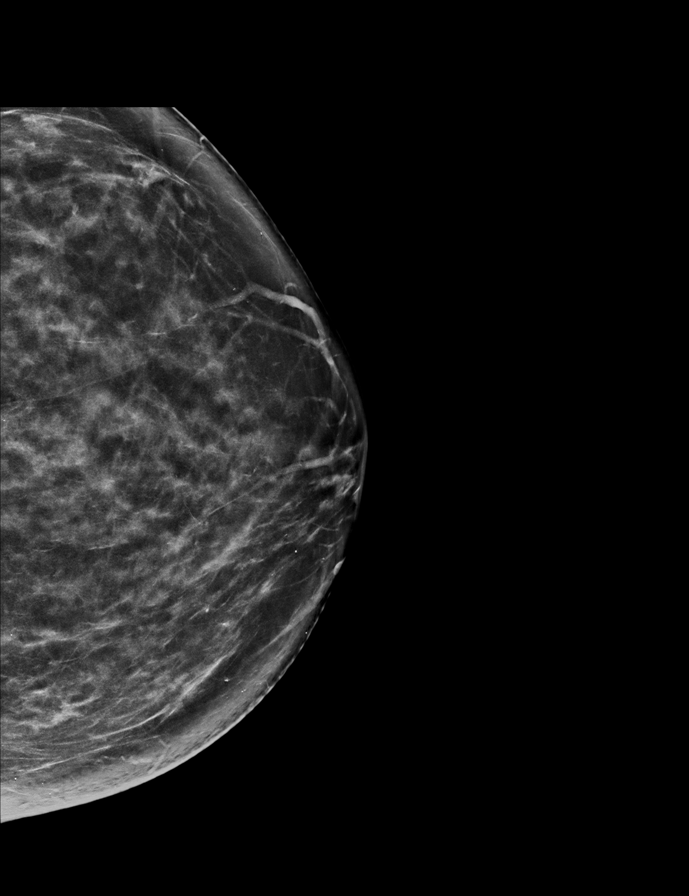

[R MLO synth-2D]
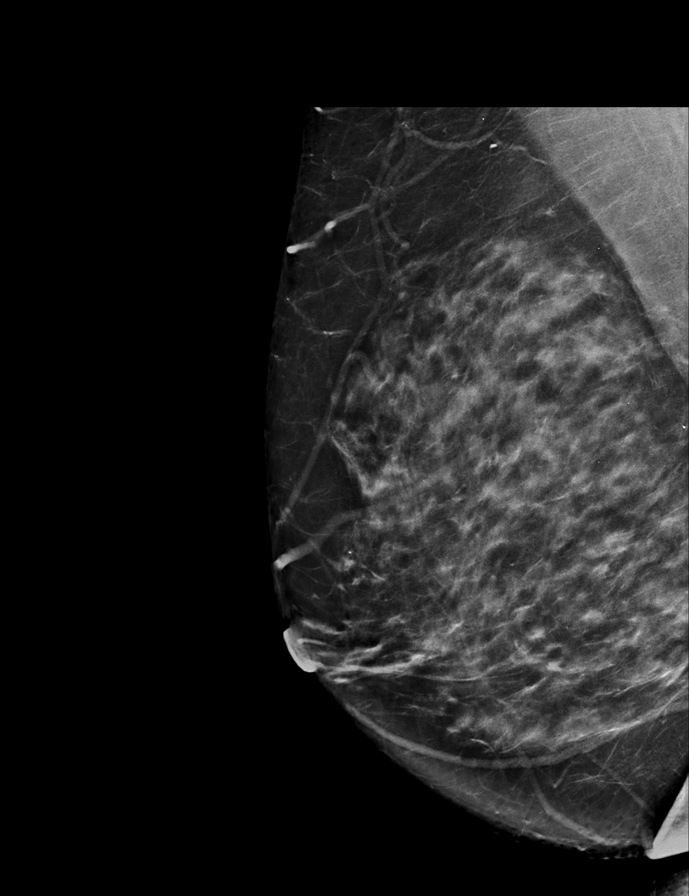

[R CC synth-2D]
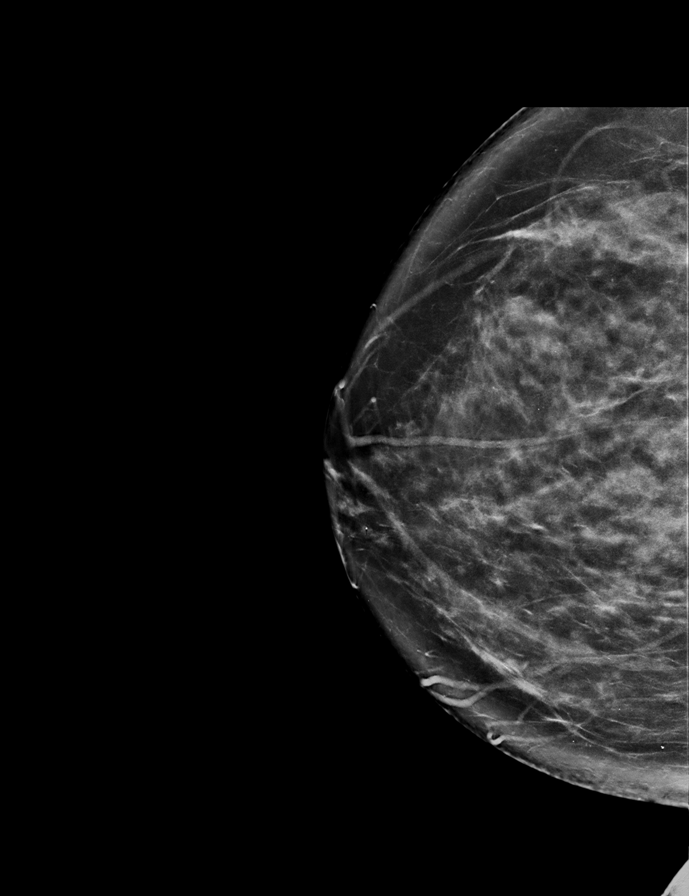

[L MLO synth-2D]
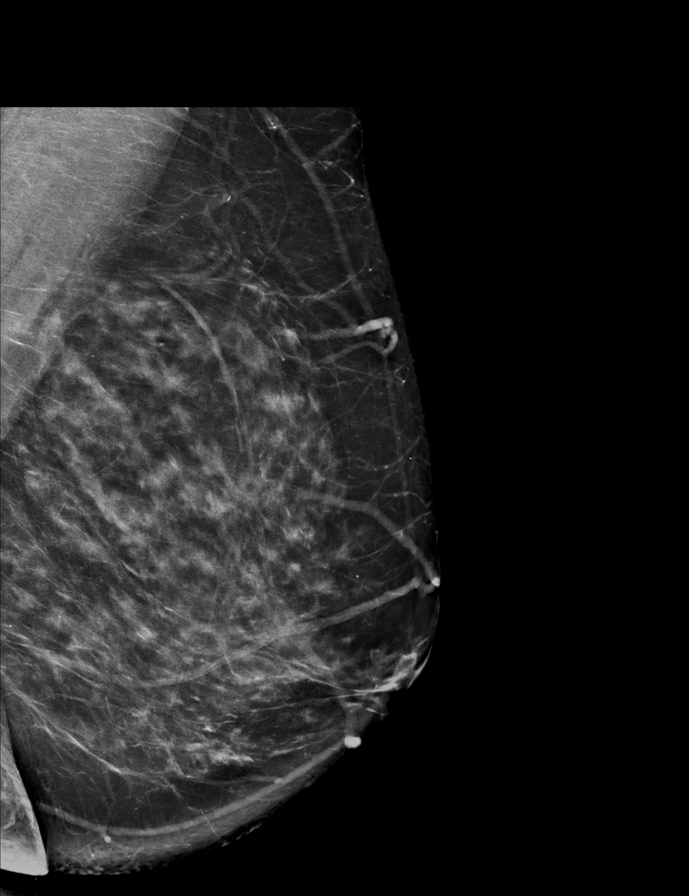

[R CC tomo · 2 of 84 frames shown]
[frame 28/84]
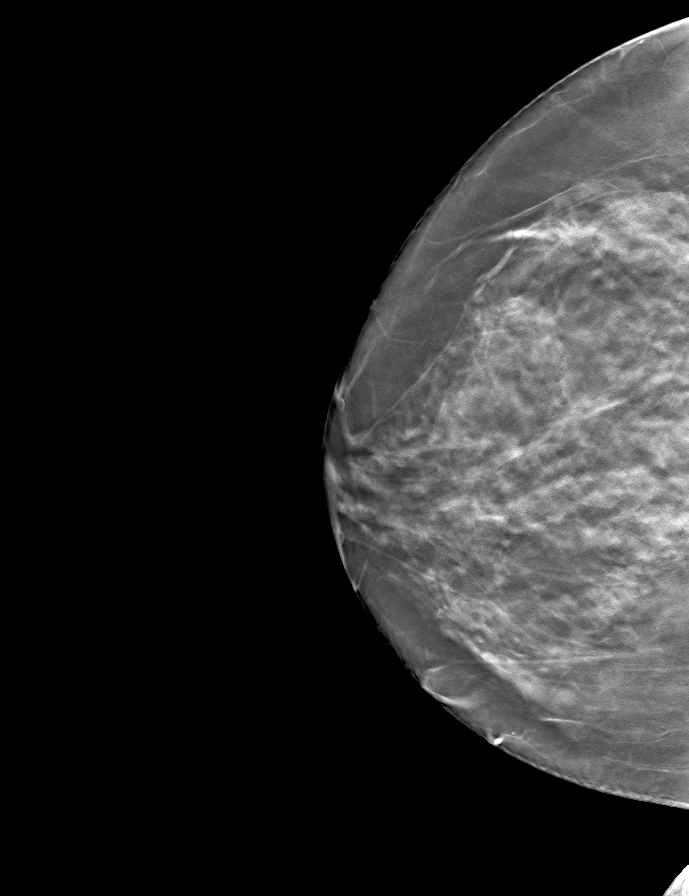
[frame 43/84]
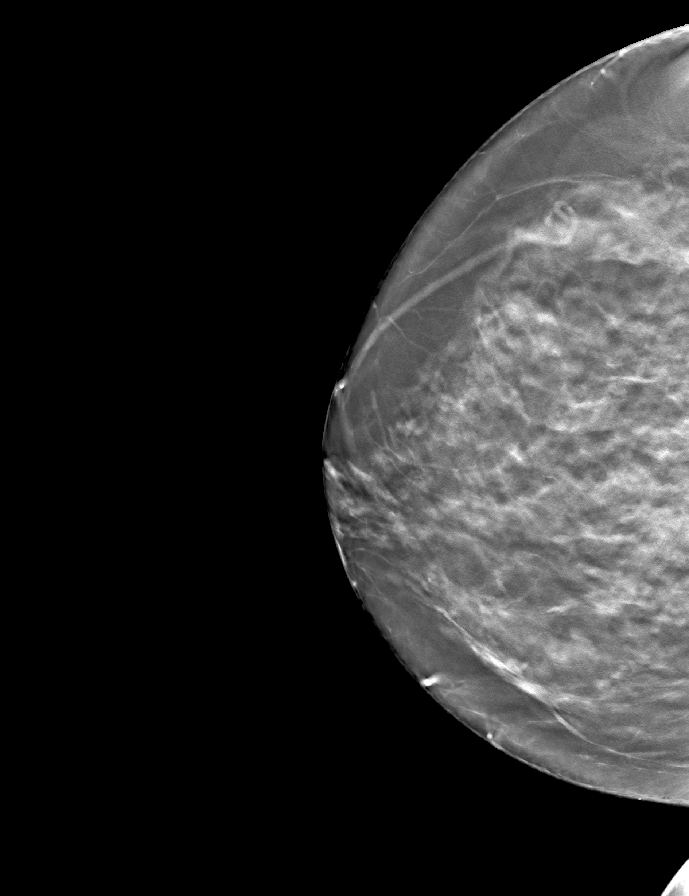

[L CC tomo · tomo slice 39/77.0]
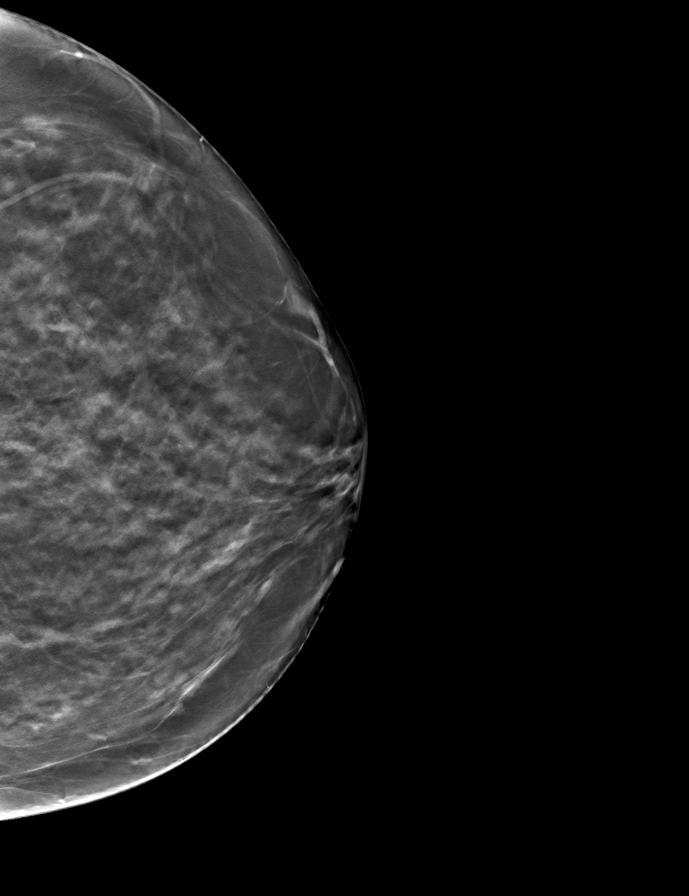

[R MLO tomo · tomo slice 45/90.0]
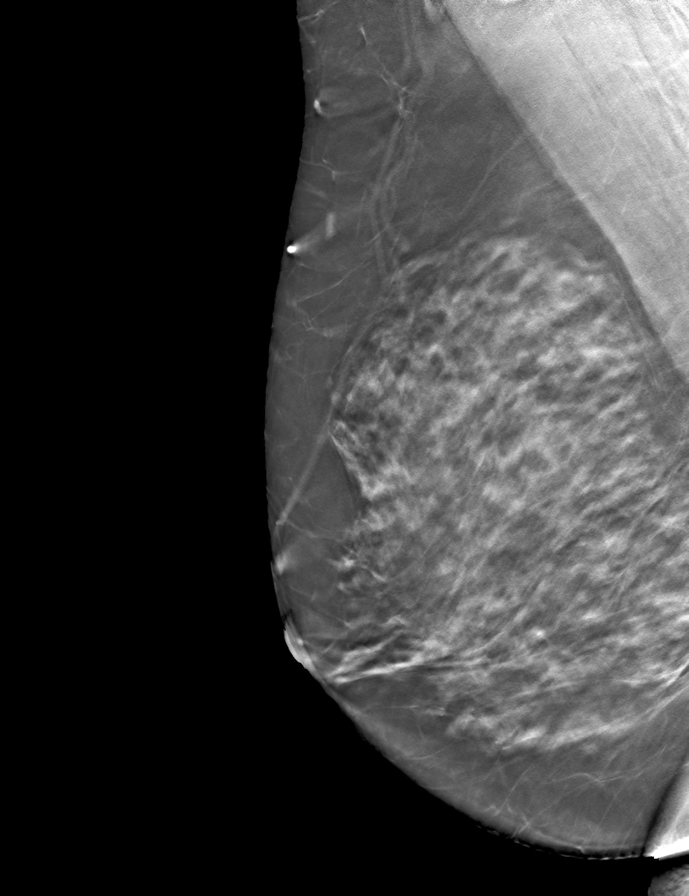

[L MLO tomo · tomo slice 43/86.0]
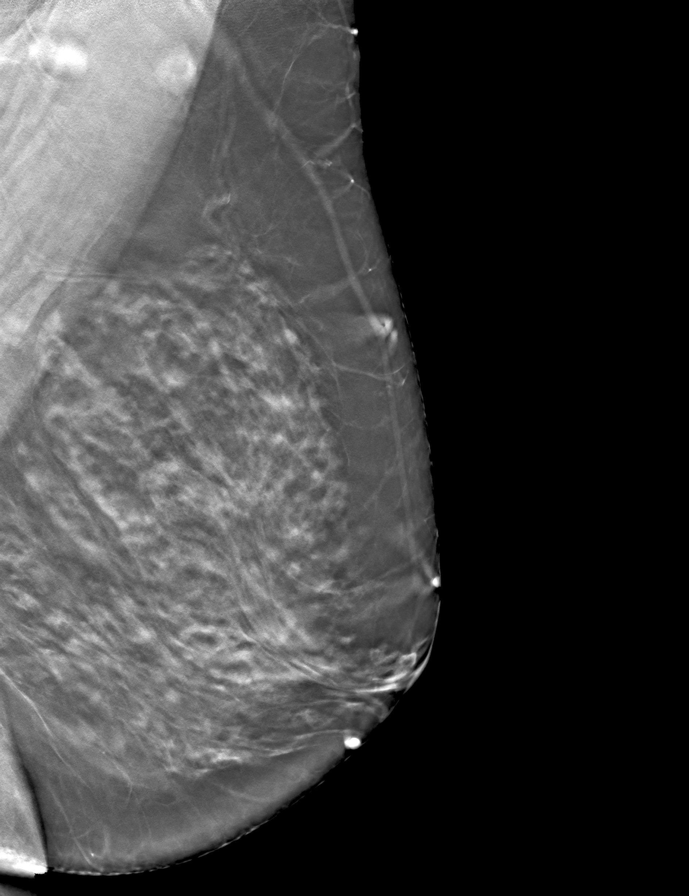

[9 of 24 positions shown; findings below may reference images not displayed]

ACR Breast Density Category c: The breast tissue is heterogeneously
dense, which may obscure small masses.
FINDINGS: There are no findings suspicious for malignancy.
IMPRESSION: No mammographic evidence of malignancy. A result letter of this
screening mammogram will be mailed directly to the patient.

RECOMMENDATION:
Screening mammogram in one year. (Code:Q3-W-BC3)

BI-RADS CATEGORY  1: Negative.

## 2022-07-03 ENCOUNTER — Ambulatory Visit: Payer: 59 | Admitting: Dermatology

## 2022-12-18 ENCOUNTER — Encounter: Payer: Self-pay | Admitting: Internal Medicine

## 2022-12-18 DIAGNOSIS — Z1231 Encounter for screening mammogram for malignant neoplasm of breast: Secondary | ICD-10-CM

## 2022-12-19 ENCOUNTER — Other Ambulatory Visit: Payer: Self-pay | Admitting: Family Medicine

## 2022-12-19 DIAGNOSIS — M79622 Pain in left upper arm: Secondary | ICD-10-CM

## 2023-01-02 ENCOUNTER — Ambulatory Visit
Admission: RE | Admit: 2023-01-02 | Discharge: 2023-01-02 | Disposition: A | Discharge status: Home / Self Care | Payer: Medicare Other | Source: Ambulatory Visit | Attending: Family Medicine | Admitting: Family Medicine

## 2023-01-02 ENCOUNTER — Other Ambulatory Visit: Payer: Self-pay | Admitting: Family Medicine

## 2023-01-02 ENCOUNTER — Ambulatory Visit
Admission: RE | Admit: 2023-01-02 | Discharge: 2023-01-02 | Disposition: A | Discharge status: Home / Self Care | Payer: 59 | Source: Ambulatory Visit | Attending: Family Medicine | Admitting: Family Medicine

## 2023-01-02 DIAGNOSIS — M79622 Pain in left upper arm: Secondary | ICD-10-CM

## 2023-01-02 DIAGNOSIS — R928 Other abnormal and inconclusive findings on diagnostic imaging of breast: Secondary | ICD-10-CM

## 2023-01-02 DIAGNOSIS — N6320 Unspecified lump in the left breast, unspecified quadrant: Secondary | ICD-10-CM

## 2023-06-27 ENCOUNTER — Other Ambulatory Visit: Payer: Self-pay | Admitting: Family Medicine

## 2023-06-27 DIAGNOSIS — N6320 Unspecified lump in the left breast, unspecified quadrant: Secondary | ICD-10-CM

## 2023-07-03 ENCOUNTER — Ambulatory Visit
Admission: RE | Admit: 2023-07-03 | Discharge: 2023-07-03 | Disposition: A | Discharge status: Home / Self Care | Payer: 59 | Source: Ambulatory Visit | Attending: Family Medicine | Admitting: Family Medicine

## 2023-07-03 DIAGNOSIS — N6320 Unspecified lump in the left breast, unspecified quadrant: Secondary | ICD-10-CM

## 2023-07-04 ENCOUNTER — Other Ambulatory Visit: Payer: Self-pay | Admitting: Family Medicine

## 2023-07-04 DIAGNOSIS — N6320 Unspecified lump in the left breast, unspecified quadrant: Secondary | ICD-10-CM

## 2024-01-04 ENCOUNTER — Ambulatory Visit
Admission: RE | Admit: 2024-01-04 | Discharge: 2024-01-04 | Disposition: A | Discharge status: Home / Self Care | Source: Ambulatory Visit | Attending: Family Medicine | Admitting: Family Medicine

## 2024-01-04 ENCOUNTER — Other Ambulatory Visit: Payer: Self-pay | Admitting: Family Medicine

## 2024-01-04 DIAGNOSIS — N6320 Unspecified lump in the left breast, unspecified quadrant: Secondary | ICD-10-CM

## 2024-01-04 DIAGNOSIS — N63 Unspecified lump in unspecified breast: Secondary | ICD-10-CM
